# Patient Record
Sex: Male | Born: 1990 | Race: Black or African American | Hispanic: No | Marital: Single | Smoking: Current every day smoker
Health system: Southern US, Community
[De-identification: ages and names within clinical notes are randomized; demographics above are authoritative.]

---

## 2017-03-21 ENCOUNTER — Emergency Department (HOSPITAL_COMMUNITY)
Admission: EM | Admit: 2017-03-21 | Discharge: 2017-03-21 | Disposition: A | Discharge status: Home / Self Care | Payer: Self-pay | Attending: Emergency Medicine | Admitting: Emergency Medicine

## 2017-03-21 ENCOUNTER — Encounter (HOSPITAL_COMMUNITY): Payer: Self-pay | Admitting: Emergency Medicine

## 2017-03-21 ENCOUNTER — Emergency Department (HOSPITAL_COMMUNITY): Payer: Self-pay

## 2017-03-21 DIAGNOSIS — R072 Precordial pain: Secondary | ICD-10-CM | POA: Insufficient documentation

## 2017-03-21 NOTE — ED Notes (Signed)
Patient transported to X-ray 

## 2017-03-21 NOTE — ED Triage Notes (Signed)
Pt BIB EMS from a place of business. Walking around in the cold, began feeling chest & shoulder tightness & pain approx 4hrs PTA. Denies SOB, N/V. Peak T waves on ECG per EMS. Received 324mg  ASA,  1 nitro tab. Pain initially 7/10, now 5/10 after ASA & Nitro.

## 2017-03-21 NOTE — ED Notes (Signed)
The npt has been sitting in the waiting room for awhile he has been discharged

## 2017-03-21 NOTE — ED Provider Notes (Signed)
[facility]EM[facility] DEPT Provider Note   CSN: [id number] Arrival date & time: 03/21/17  0202  By signing my name below, I, Octavia HeirArianna Nassar, attest that this documentation has been prepared under the direction and in the presence of Azalia BilisKevin Cordarrel Stiefel, MD.  Electronically Signed: Octavia HeirArianna Nassar, ED Scribe. 03/21/17. 2:28 AM.    History   Chief Complaint Chief Complaint  Patient presents with  . Chest Pain   The history is provided by the patient. No language interpreter was used.   HPI Comments: Chad Hale is a 26 y.o. male brought in by ambulance, who presents to the Emergency Department complaining of acute onset, moderate, gradual improving chest pain x 4 hours. He reports associated mild cough. Pt says that he was walking outside in the cold when he began to have chest pain. He reports he just moved up here and does not have a very good place to stay. Pt received 324 mg of ASA and 1 NTG by EMS which modified his pain minimally. He denies shortness of breath.   No past medical history on file.  There are no active problems to display for this patient.   No past surgical history on file.     Home Medications    Prior to Admission medications   Not on File    Family History No family history on file.  Social History Social History  Substance Use Topics  . Smoking status: Not on file  . Smokeless tobacco: Not on file  . Alcohol use Not on file     Allergies   Patient has no allergy information on record.   Review of Systems Review of Systems  A complete 10 system review of systems was obtained and all systems are negative except as noted in the HPI and PMH.   Physical Exam Updated Vital Signs SpO2 98%   Physical Exam  Constitutional: He is oriented to person, place, and time. He appears well-developed and well-nourished.  HENT:  Head: Normocephalic and atraumatic.  Eyes: EOM are normal.  Neck: Normal range of motion.  Cardiovascular: Normal rate, regular  rhythm, normal heart sounds and intact distal pulses.   Pulmonary/Chest: Effort normal and breath sounds normal. No respiratory distress.  Abdominal: Soft. He exhibits no distension. There is no tenderness.  Musculoskeletal: Normal range of motion.  Neurological: He is alert and oriented to person, place, and time.  Skin: Skin is warm and dry.  Psychiatric: He has a normal mood and affect. Judgment normal.  Nursing note and vitals reviewed.    ED Treatments / Results  DIAGNOSTIC STUDIES: Oxygen Saturation is 98% on RA, normal by my interpretation.  COORDINATION OF CARE:  2:27 AM Discussed treatment plan which includes CXR with pt at bedside and pt agreed to plan.  Labs (all labs ordered are listed, but only abnormal results are displayed) Labs Reviewed - No data to display  EKG  EKG Interpretation  Date/Time:  Saturday March 21 2017 02:23:23 EDT Ventricular Rate:  53 PR Interval:    QRS Duration: 79 QT Interval:  436 QTC Calculation: 410 R Axis:   80 Text Interpretation:  Sinus rhythm RSR' in V1 or V2, probably normal variant Early repolarization No old tracing to compare Confirmed by Navy Rothschild  MD, Caryn BeeKEVIN ([id number]) on 03/21/2017 2:43:10 AM       Radiology Dg Chest 2 View  Result Date: 03/21/2017 CLINICAL DATA:  26 year old male with chest pain. EXAM: CHEST  2 VIEW COMPARISON:  None. FINDINGS: There is mild eventration of  the left hemidiaphragm with minimal left lung base atelectatic changes. No focal consolidation, pleural effusion, or pneumothorax. The cardiac silhouette is within normal limits. No acute osseous pathology. IMPRESSION: No active cardiopulmonary disease. Electronically Signed   By: Elgie Collard M.D.   On: 03/21/2017 03:15    Procedures Procedures (including critical care time)  Medications Ordered in ED Medications - No data to display   Initial Impression / Assessment and Plan / ED Course  I have reviewed the triage vital signs and the nursing  notes.  Pertinent labs & imaging results that were available during my care of the patient were reviewed by me and considered in my medical decision making (see chart for details).     Patient is overall well-appearing.  Atypical chest pain.  No chest pain on arrival to emergency department.  Doubt PE.  Doubt ACS.  Patient is PERC negative.  EKG without ischemic changes.  Chest x-ray clear.  Home with primary care follow-up.  He understands return to the ER for new or worsening symptoms   Final Clinical Impressions(s) / ED Diagnoses   Final diagnoses:  Precordial pain   I personally performed the services described in this documentation, which was scribed in my presence. The recorded information has been reviewed and is accurate.     New Prescriptions New Prescriptions   No medications on file     Azalia Bilis, MD 03/21/17 [phone]

## 2017-03-26 ENCOUNTER — Emergency Department (HOSPITAL_COMMUNITY)
Admission: EM | Admit: 2017-03-26 | Discharge: 2017-03-26 | Disposition: A | Discharge status: Home / Self Care | Payer: Self-pay | Attending: Emergency Medicine | Admitting: Emergency Medicine

## 2017-03-26 ENCOUNTER — Encounter (HOSPITAL_COMMUNITY): Payer: Self-pay

## 2017-03-26 ENCOUNTER — Inpatient Hospital Stay (HOSPITAL_COMMUNITY)
Admission: AD | Admit: 2017-03-26 | Discharge: 2017-03-31 | DRG: 885 | Disposition: A | Discharge status: Home / Self Care | Payer: Federal, State, Local not specified - Other | Source: Intra-hospital | Attending: Psychiatry | Admitting: Psychiatry

## 2017-03-26 ENCOUNTER — Encounter (HOSPITAL_COMMUNITY): Payer: Self-pay | Admitting: *Deleted

## 2017-03-26 DIAGNOSIS — T50901A Poisoning by unspecified drugs, medicaments and biological substances, accidental (unintentional), initial encounter: Secondary | ICD-10-CM

## 2017-03-26 DIAGNOSIS — R45851 Suicidal ideations: Secondary | ICD-10-CM | POA: Diagnosis present

## 2017-03-26 DIAGNOSIS — Z590 Homelessness: Secondary | ICD-10-CM

## 2017-03-26 DIAGNOSIS — Z91013 Allergy to seafood: Secondary | ICD-10-CM | POA: Diagnosis not present

## 2017-03-26 DIAGNOSIS — F329 Major depressive disorder, single episode, unspecified: Secondary | ICD-10-CM | POA: Diagnosis not present

## 2017-03-26 DIAGNOSIS — F332 Major depressive disorder, recurrent severe without psychotic features: Secondary | ICD-10-CM | POA: Diagnosis not present

## 2017-03-26 DIAGNOSIS — F17200 Nicotine dependence, unspecified, uncomplicated: Secondary | ICD-10-CM | POA: Insufficient documentation

## 2017-03-26 DIAGNOSIS — Z79899 Other long term (current) drug therapy: Secondary | ICD-10-CM | POA: Insufficient documentation

## 2017-03-26 DIAGNOSIS — Z915 Personal history of self-harm: Secondary | ICD-10-CM | POA: Diagnosis not present

## 2017-03-26 DIAGNOSIS — F419 Anxiety disorder, unspecified: Secondary | ICD-10-CM | POA: Diagnosis not present

## 2017-03-26 DIAGNOSIS — F17210 Nicotine dependence, cigarettes, uncomplicated: Secondary | ICD-10-CM | POA: Diagnosis not present

## 2017-03-26 DIAGNOSIS — T465X4A Poisoning by other antihypertensive drugs, undetermined, initial encounter: Secondary | ICD-10-CM | POA: Diagnosis present

## 2017-03-26 DIAGNOSIS — Y939 Activity, unspecified: Secondary | ICD-10-CM | POA: Insufficient documentation

## 2017-03-26 DIAGNOSIS — F411 Generalized anxiety disorder: Secondary | ICD-10-CM | POA: Diagnosis present

## 2017-03-26 DIAGNOSIS — G4700 Insomnia, unspecified: Secondary | ICD-10-CM | POA: Diagnosis not present

## 2017-03-26 DIAGNOSIS — Y929 Unspecified place or not applicable: Secondary | ICD-10-CM | POA: Insufficient documentation

## 2017-03-26 DIAGNOSIS — T1491XA Suicide attempt, initial encounter: Secondary | ICD-10-CM | POA: Insufficient documentation

## 2017-03-26 DIAGNOSIS — Y999 Unspecified external cause status: Secondary | ICD-10-CM | POA: Insufficient documentation

## 2017-03-26 DIAGNOSIS — X58XXXA Exposure to other specified factors, initial encounter: Secondary | ICD-10-CM | POA: Insufficient documentation

## 2017-03-26 LAB — RAPID URINE DRUG SCREEN, HOSP PERFORMED
Amphetamines: NOT DETECTED
BARBITURATES: NOT DETECTED
Benzodiazepines: NOT DETECTED
Cocaine: NOT DETECTED
OPIATES: NOT DETECTED
TETRAHYDROCANNABINOL: POSITIVE — AB

## 2017-03-26 LAB — COMPREHENSIVE METABOLIC PANEL
ALBUMIN: 3.9 g/dL (ref 3.5–5.0)
ALK PHOS: 53 U/L (ref 38–126)
ALT: 23 U/L (ref 17–63)
AST: 28 U/L (ref 15–41)
Anion gap: 8 (ref 5–15)
BUN: 7 mg/dL (ref 6–20)
CHLORIDE: 104 mmol/L (ref 101–111)
CO2: 26 mmol/L (ref 22–32)
CREATININE: 1.13 mg/dL (ref 0.61–1.24)
Calcium: 8.9 mg/dL (ref 8.9–10.3)
GFR calc Af Amer: >60 mL/min (ref >60)
GFR calc non Af Amer: >60 mL/min (ref >60)
GLUCOSE: 116 mg/dL — AB (ref 65–99)
Potassium: 3.3 mmol/L — ABNORMAL LOW (ref 3.5–5.1)
SODIUM: 138 mmol/L (ref 135–145)
Total Bilirubin: 0.7 mg/dL (ref 0.3–1.2)
Total Protein: 6.3 g/dL — ABNORMAL LOW (ref 6.5–8.1)

## 2017-03-26 LAB — SALICYLATE LEVEL: Salicylate Lvl: <7 mg/dL (ref 2.8–30.0)

## 2017-03-26 LAB — CBC
HEMATOCRIT: 40.3 % (ref 39.0–52.0)
HEMOGLOBIN: 13.2 g/dL (ref 13.0–17.0)
MCH: 27.1 pg (ref 26.0–34.0)
MCHC: 32.8 g/dL (ref 30.0–36.0)
MCV: 82.8 fL (ref 78.0–100.0)
Platelets: 259 10*3/uL (ref 150–400)
RBC: 4.87 MIL/uL (ref 4.22–5.81)
RDW: 12.7 % (ref 11.5–15.5)
WBC: 8.6 10*3/uL (ref 4.0–10.5)

## 2017-03-26 LAB — ETHANOL: Alcohol, Ethyl (B): <5 mg/dL (ref <5)

## 2017-03-26 LAB — ACETAMINOPHEN LEVEL: Acetaminophen (Tylenol), Serum: <10 ug/mL — ABNORMAL LOW (ref 10–30)

## 2017-03-26 MED ORDER — LORAZEPAM 1 MG PO TABS: 1.0000 mg | ORAL_TABLET | Freq: Three times a day (TID) | ORAL | Status: DC | PRN

## 2017-03-26 MED ORDER — IBUPROFEN 400 MG PO TABS: 600.0000 mg | ORAL_TABLET | Freq: Three times a day (TID) | ORAL | Status: DC | PRN

## 2017-03-26 MED ORDER — MAGNESIUM HYDROXIDE 400 MG/5ML PO SUSP
30.0000 mL | Freq: Every day | ORAL | Status: DC | PRN
  Administered 2017-03-26: 30 mL via ORAL
  Filled 2017-03-26: qty 30

## 2017-03-26 MED ORDER — HYDROXYZINE HCL 25 MG PO TABS
25.0000 mg | ORAL_TABLET | Freq: Four times a day (QID) | ORAL | Status: DC | PRN
  Administered 2017-03-27 – 2017-03-31 (×8): 25 mg via ORAL
  Filled 2017-03-26 (×5): qty 1
  Filled 2017-03-26: qty 10
  Filled 2017-03-26 (×4): qty 1

## 2017-03-26 MED ORDER — TRAZODONE HCL 100 MG PO TABS
100.0000 mg | ORAL_TABLET | Freq: Every evening | ORAL | Status: DC | PRN
  Administered 2017-03-26 – 2017-03-30 (×5): 100 mg via ORAL
  Filled 2017-03-26 (×4): qty 1
  Filled 2017-03-26: qty 7
  Filled 2017-03-26: qty 1

## 2017-03-26 MED ORDER — ACETAMINOPHEN 325 MG PO TABS
650.0000 mg | ORAL_TABLET | Freq: Four times a day (QID) | ORAL | Status: DC | PRN
  Administered 2017-03-28 (×2): 650 mg via ORAL
  Filled 2017-03-26 (×2): qty 2

## 2017-03-26 NOTE — BH Assessment (Signed)
Per Dr. Lucianne MussKumar - patient meets criteria for inpatient hospitalization. [facility]  The patient can come after 12.  Dr. Jama Flavorsobos is the accepting physician.   Writer informed the nurse of the disposition.

## 2017-03-26 NOTE — ED Notes (Signed)
Report received from [place], [place] -- pt ambulatory to rm F8 -- eating breakfast. Pt denies hearing voices at present, states he was SI when he took pills, but feels safe at present. Sitter at bedside.

## 2017-03-26 NOTE — ED Notes (Signed)
Staffing office made aware of need for suicide sitter for patient.

## 2017-03-26 NOTE — Progress Notes (Addendum)
Admission note:  Patient is very vague about why he is here.  He is a 26 yo male that [facility]."  Reportedly, patient took approximately 6-8 clonidine pills and "felt sick."  He states he just separated from his wife "a week ago and she was here last week."  Patient was angry and agitated.  At first, he refused to sign any papers or cooperate with admission.  He stated, "I want to go."  Explained to patient he was under IVC and that we had no control over that.  He states he felt some suicidal ideation over his separation.  He then shared that his wife was admitted here "to get away from me."  Patient states, "I have a two court dates coming up for domestic assault and carrying a concealed weapon."  Note:  Patient came in with two large knives.  Patient arrived with several bags of clothing, personal laptop, cell phone, wallet, and $175.00 in cash which was locked up in the safe. From ED report, patient was originally voluntary and when [place] arrived, he refused to go.  He was IVC'd due to his threats of "running."  Patient has no pertinent medical hx.  He is a smoker; refuses the patch.  He did sign his paperwork and cooperated after some prompting.  [facility] on 500 hall due to possible elopement risk/escalating behavior.  Patient is calm at this time.  He denies any use of alcohol or drugs.  He denies any depressive symptoms or thoughts of self harm.  He denies HI and does not appear to be responding to internal stimuli.  Note:  ED nurse did report that patient has been to various hospitals in different locations recently. Patient's UDS was positive for THC.  Patient also stated, "I need a drug assessment for court."   Patient was oriented to unit and he went to dinner.

## 2017-03-26 NOTE — Progress Notes (Signed)
Pt is new to the unit late this afternoon.  Report received from admitting nurse that pt was irritable and upset about being in the hospital.  After shift change, pt came to the nurse's station asking whether he was voluntary or involuntary.  Pt was shown his IVC paperwork and informed that the doctor would decide when or if he could sign voluntary.  He wanted to sign a 72h request for discharge, but was understanding and appropriate when given this information and told he had to be voluntary to sign the form.  Pt requested that his Dove soap be gotten out of the locker.  Writer got the soap and a couple of other toiletry items for pt.  He was appreciative, and has been polite and appropriate with staff this evening.  He went to [employer] group and participated.  He has made his needs known appropriately.  He denies SI/HI/AVH at this time.  Support and encouragement offered.  Discharge plans are in process.  Safety maintained with q15 minute checks.

## 2017-03-26 NOTE — BH Assessment (Signed)
Tele Assessment Note   Governor RooksMercedes Hale is an 26 y.o. male that reports SI with a plan to overdose.  Patient reports increased depressed associated with being separated from his wife.   Patient reports that he is not able to contract for safety.    Patient denies HI/Psychosis/Substance Abuse.     Diagnosis: Major Depressive Disorder, Severe, without psychosis  Past Medical History: History reviewed. No pertinent past medical history.  History reviewed. No pertinent surgical history.  Family History: No family history on file.  Social History:  reports that he has been smoking.  He has been smoking about 1.00 pack per day. He has never used smokeless tobacco. He reports that he does not drink alcohol. His drug history is not on file.  Additional Social History:  Alcohol / Drug Use History of alcohol / drug use?: No history of alcohol / drug abuse  CIWA: CIWA-Ar BP: 114/84 Pulse Rate: 72 COWS:    PATIENT STRENGTHS: (choose at least two) Average or above average intelligence Capable of independent living Communication skills Physical Health  Allergies:  Allergies  Allergen Reactions  . Shellfish Allergy Anaphylaxis    Home Medications:  (Not in a hospital admission)  OB/GYN Status:  No LMP for male patient.  General Assessment Data [facility] Assessment: In system Is this a Tele or Face-to-Face Assessment?: Tele Assessment Is this an Initial Assessment or a Re-assessment for this encounter?: Initial Assessment Marital status: Single Maiden name: NA Is patient pregnant?: No Pregnancy Status: No Living Arrangements: Other relatives Can pt return to current living arrangement?: Yes Admission Status: Voluntary Is patient capable of signing voluntary admission?: Yes Referral Source: Self/Family/Friend Insurance type: Self Pay  Medical Screening Exam [facility] Walk-[facility] ONLY) Medical Exam completed:  (NA)  Crisis Care Plan Living Arrangements:  Other relatives Legal Guardian:  (NA) Name of Psychiatrist: None Reorted Name of Therapist: None Reorted  Education Status Is patient currently in school?: No Current Grade: NA Highest grade of school patient has completed: NA Name of school: NA Contact person: N  Risk to self with the past 6 months Suicidal Ideation: Yes-Currently Present Has patient been a risk to self within the past 6 months prior to admission? : Yes Suicidal Intent: Yes-Currently Present Has patient had any suicidal intent within the past 6 months prior to admission? : Yes Is patient at risk for suicide?: Yes Suicidal Plan?: Yes-Currently Present Has patient had any suicidal plan within the past 6 months prior to admission? : Yes Specify Current Suicidal Plan: Plan to overdose on  pills Access to Means: Yes Specify Access to Suicidal Means: Overdose on pills What has been your use of drugs/alcohol within the last 12 months?: NA Previous Attempts/Gestures: No How many times?: 0 Other Self Harm Risks: None Reported Triggers for Past Attempts:  (NA) Intentional Self Injurious Behavior: None Family Suicide History: No Recent stressful life event(s): Divorce (Seperated from his wife) Persecutory voices/beliefs?: No Depression: Yes Depression Symptoms: Despondent, Insomnia, Tearfulness, Isolating, Fatigue, Loss of interest in usual pleasures, Guilt, Feeling worthless/self pity, Feeling angry/irritable Substance abuse history and/or treatment for substance abuse?: No Suicide prevention information given to non-admitted patients: Not applicable  Risk to Others within the past 6 months Homicidal Ideation: No Does patient have any lifetime risk of violence toward others beyond the six months prior to admission? : No Thoughts of Harm to Others: No Current Homicidal Intent: No Current Homicidal Plan: No Access to Homicidal Means: No Identified Victim: None Reported  History of harm to others?: No Assessment of  Violence: None Noted Violent Behavior Description: n Does patient have access to weapons?: No Criminal Charges Pending?: Yes Describe Pending Criminal Charges: Possession of a gun Does patient have a court date: Yes Court Date: 04/25/17 Is patient on probation?: No  Psychosis Hallucinations: None noted Delusions: None noted  Mental Status Report Appearance/Hygiene: Disheveled Eye Contact: Poor Motor Activity: Freedom of movement Speech: Logical/coherent Level of Consciousness: Alert Mood: Depressed, Despair, Fearful, Guilty, Helpless Affect: Blunted Anxiety Level: Minimal Thought Processes: Coherent, Relevant Judgement: Impaired Orientation: Person, Place, Time, Situation Obsessive Compulsive Thoughts/Behaviors: None  Cognitive Functioning Concentration: Decreased Memory: Recent Intact, Remote Intact IQ: Average Insight: Poor Impulse Control: Poor Appetite: Poor Weight Loss: 5 Weight Gain: 0 Sleep: Decreased Total Hours of Sleep: 4 Vegetative Symptoms: Decreased grooming, Staying in bed  ADLScreening [facility] Assessment Services) Patient's cognitive ability adequate to safely complete daily activities?: Yes Patient able to express need for assistance with ADLs?: Yes Independently performs ADLs?: Yes (appropriate for developmental age)  Prior Inpatient Therapy Prior Inpatient Therapy: No Prior Therapy Dates: NA Prior Therapy Facilty/Provider(s): NA Reason for Treatment: NA  Prior Outpatient Therapy Prior Outpatient Therapy: No Prior Therapy Dates: NA Prior Therapy Facilty/Provider(s): NA Reason for Treatment: NA Does patient have an ACCT team?: No Does patient have Intensive In-House Services?  : No Does patient have [facility] services? : No Does patient have P4CC services?: No  ADL Screening (condition at time of admission) Patient's cognitive ability adequate to safely complete daily activities?: Yes Is the patient deaf or have difficulty hearing?: No Does  the patient have difficulty seeing, even when wearing glasses/contacts?: No Does the patient have difficulty concentrating, remembering, or making decisions?: Yes Patient able to express need for assistance with ADLs?: Yes Does the patient have difficulty dressing or bathing?: No Independently performs ADLs?: Yes (appropriate for developmental age) Does the patient have difficulty walking or climbing stairs?: No Weakness of Legs: None Weakness of Arms/Hands: None  Home Assistive Devices/Equipment Home Assistive Devices/Equipment: None    Abuse/Neglect Assessment (Assessment to be complete while patient is alone) Physical Abuse: Denies Verbal Abuse: Denies Sexual Abuse: Denies Exploitation of patient/patient's resources: Denies Self-Neglect: Denies Values / Beliefs Cultural Requests During Hospitalization: None Spiritual Requests During Hospitalization: None Consults Spiritual Care Consult Needed: No Social Work Consult Needed: No Merchant navy officer (For Healthcare) Does Patient Have a Medical Advance Directive?: No Would patient like information on creating a medical advance directive?: No - Patient declined    Additional Information 1:1 In Past 12 Months?: No CIRT Risk: No Elopement Risk: No Does patient have medical clearance?: Yes     Disposition: Per Dr. Lucianne Muss - patient meets criteria for inpatient hospitalization. Per [facility] Mardella Layman) patient accepted to [facility] Bed 401-2.  The patient can come after 12.  Dr. Lucianne Muss is the accepting physician.    Disposition Initial Assessment Completed for this Encounter: Yes Disposition of Patient: Inpatient treatment program Type of inpatient treatment program: Adult  Linton Rump 03/26/2017 10:43 AM

## 2017-03-26 NOTE — ED Provider Notes (Signed)
[facility]-EM[facility]Y DEPT Provider Note   CSN: [id number] Arrival date & time: 03/26/17  0215     History   Chief Complaint Chief Complaint  Patient presents with  . Suicide Attempt  . Drug Overdose    HPI Chad Hale is a 26 y.o. male.  The history is provided by the patient.  Drug Overdose  This is a new problem. The current episode started 6 to 12 hours ago. The problem occurs constantly. The problem has been gradually improving. Associated symptoms include abdominal pain. Pertinent negatives include no chest pain and no shortness of breath. Nothing aggravates the symptoms. Nothing relieves the symptoms.  Patient reports taking about 6 tablets of clonidine (unknown dosage) At approximately 2200 He did this in a suicide attempt He reports he has abdominal discomfort since taking the meds Denies ETOH use No fever No CP No significant HA   PMH - none  Home Medications    Prior to Admission medications   Not on File    Family History No family history on file.  Social History Social History  Substance Use Topics  . Smoking status: Current Every Day Smoker    Packs/day: 1.00  . Smokeless tobacco: Never Used  . Alcohol use No     Allergies   Shellfish allergy   Review of Systems Review of Systems  Constitutional: Negative for fever.  Respiratory: Negative for shortness of breath.   Cardiovascular: Negative for chest pain.  Gastrointestinal: Positive for abdominal pain.  All other systems reviewed and are negative.    Physical Exam Updated Vital Signs BP (!) 118/58 (BP Location: Right Arm)   Pulse 62   Temp 97.7 F (36.5 C) (Oral)   Resp 16   Ht 5\' 10"  (1.778 m)   Wt 72.6 kg   SpO2 100%   BMI 22.96 kg/m   Physical Exam CONSTITUTIONAL:  Disheveled, no acute distress HEAD: Normocephalic/atraumatic EYES: EOMI/PERRL ENMT: Mucous membranes moist NECK: supple no meningeal signs SPINE/BACK:entire spine nontender CV: S1/S2 noted, no  murmurs/rubs/gallops noted LUNGS: Lungs are clear to auscultation bilaterally, no apparent distress ABDOMEN: soft, mild epigastric tenderness, no rebound or guarding, bowel sounds noted throughout abdomen GU:no cva tenderness NEURO: Pt is awake/alert/appropriate, moves all extremitiesx4.  No facial droop.   EXTREMITIES: pulses normal/equal, full ROM SKIN: warm, color normal PSYCH: pt appears depressed  ED Treatments / Results  Labs (all labs ordered are listed, but only abnormal results are displayed) Labs Reviewed  COMPREHENSIVE METABOLIC PANEL - Abnormal; Notable for the following:       Result Value   Potassium 3.3 (*)    Glucose, Bld 116 (*)    Total Protein 6.3 (*)    All other components within normal limits  ACETAMINOPHEN LEVEL - Abnormal; Notable for the following:    Acetaminophen (Tylenol), Serum <10 (*)    All other components within normal limits  RAPID URINE DRUG SCREEN, HOSP PERFORMED - Abnormal; Notable for the following:    Tetrahydrocannabinol POSITIVE (*)    All other components within normal limits  ETHANOL  SALICYLATE LEVEL  CBC    EKG  EKG Interpretation  Date/Time:  Thursday March 26 2017 02:40:51 EDT Ventricular Rate:  61 PR Interval:  128 QRS Duration: 92 QT Interval:  398 QTC Calculation: 400 R Axis:   77 Text Interpretation:  Normal sinus rhythm Septal infarct , age undetermined Abnormal ECG No significant change since last tracing Confirmed by Bebe Shaggy  MD, Haedyn Breau ([id number]) on 03/26/2017 5:06:27 AM  Radiology No results found.  Procedures Procedures (  Medications Ordered in ED Medications - No data to display   Initial Impression / Assessment and Plan / ED Course  I have reviewed the triage vital signs and the nursing notes.  Pertinent labs  results that were available during my care of the patient were reviewed by me and considered in my medical decision making (see chart for details).     6:14 AM Pt stable No EKG  changes Vitals are appropriate It has been >6 hrs since ingestion, and per poison center, pt should be cleared Labs reassuring Pt awake/alert Will consult psych Pt medically stable at this time  Final Clinical Impressions(s) / ED Diagnoses   Final diagnoses:  Suicide attempt    New Prescriptions New Prescriptions   No medications on file     Zadie Rhineonald Myleigh Amara, MD 03/26/17 [phone]

## 2017-03-26 NOTE — ED Notes (Signed)
This RN contacted poison control regarding patient's reported ingestion of clonidine, Chad Hale with poison control advised to monitor patient for hypotension, bradycardia and drowsiness, stating that if no significant s/s occur within about 6 hours of pts reported ingestion time, patient should be safe to be medically cleared.
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
## 2017-03-26 NOTE — ED Triage Notes (Signed)
Pt reports taking pills that he stole from a friend and not feeling well. Pt sates he does not know what the name of the pill is but thinks it may have been clonidine. HE is unsure of th MG but sates about 6 pills at about 3 hours ago. PT states he took the pills with intentions to kill himself but does not know if he still wants to. No HI. Pt is complaining of abdominal pain since ingestion

## 2017-03-26 NOTE — ED Notes (Signed)
TTS video conference camera set up at bedside .

## 2017-03-26 NOTE — ED Notes (Signed)
[place] transportation here-- explained to pt that [facility] and [facility] here-- pt stated- "what if I run? I don't want to go there" when asked why, pt stated "I don't have the money, don't have time" pt eating lunch. Sent [place]ham away, [facility] pt to be IVC'd. 24hour and IVC paperwork filled out, faxed to magistrate.

## 2017-03-26 NOTE — ED Notes (Signed)
Pt placed in wine scrubs and wanded by security. Pt belongings placed outside of room at nurses station and 2 knifes confiscated by security.

## 2017-03-27 DIAGNOSIS — T465X4A Poisoning by other antihypertensive drugs, undetermined, initial encounter: Secondary | ICD-10-CM

## 2017-03-27 DIAGNOSIS — Z79899 Other long term (current) drug therapy: Secondary | ICD-10-CM

## 2017-03-27 DIAGNOSIS — F329 Major depressive disorder, single episode, unspecified: Secondary | ICD-10-CM

## 2017-03-27 MED ORDER — SERTRALINE HCL 50 MG PO TABS
50.0000 mg | ORAL_TABLET | Freq: Every day | ORAL | Status: DC
  Filled 2017-03-27 (×4): qty 1

## 2017-03-27 MED ORDER — POTASSIUM CHLORIDE CRYS ER 10 MEQ PO TBCR
10.0000 meq | EXTENDED_RELEASE_TABLET | Freq: Two times a day (BID) | ORAL | Status: AC
  Administered 2017-03-27: 10 meq via ORAL
  Filled 2017-03-27 (×3): qty 1

## 2017-03-27 MED ORDER — NICOTINE 21 MG/24HR TD PT24
21.0000 mg | MEDICATED_PATCH | Freq: Every day | TRANSDERMAL | Status: DC
  Filled 2017-03-27 (×5): qty 1

## 2017-03-27 NOTE — Plan of Care (Signed)
Problem: Safety: Goal: Periods of time without injury will increase Outcome: Progressing Pt safe on the unit at this time   

## 2017-03-27 NOTE — Progress Notes (Signed)
Recreation Therapy Notes  INPATIENT RECREATION THERAPY ASSESSMENT  Patient Details Name: Chad Hale MRN: [id number] DOB: [birth year] Today's Date: 03/27/2017  Patient Stressors: Family, Work  Pt stated he was here for suicide attempt.  Coping Skills:   Substance Abuse, Avoidance, Self-Injury, Exercise, Talking, Music, Sports  Personal Challenges: Anger, Communication, Concentration, Decision-Making, Expressing Yourself, Problem-Solving, Relationships, Self-Esteem/Confidence, Stress Management, Time Management, Trusting Others  Leisure Interests (2+):  Sports - Basketball, Music - Listen, Individual - Other (Comment) (Go for a drive)  Awareness of Community Resources:  Yes  Community Resources:  [place]  Current Use: Yes  Patient Strengths:  People person  Patient Identified Areas of Improvement:  Communication; Time management  Current Recreation Participation:  Hardly ever  Patient Goal for Hospitalization:  "I don't really have one, clear up some of these thoughts in my head"  [place] of Residence:  [place] of Residence:  [place]  Current [place] (including self-harm):  No  Current HI:  No  Consent to Intern Participation: N/A   Caroll Rancher, LRT/CTRS  Caroll Rancher A 03/27/2017, 2:11 PM

## 2017-03-27 NOTE — Progress Notes (Signed)
Recreation Therapy Notes  Date: 03/27/17 Time: 1000 Location: 500 [address] Dayroom  Group Topic: Leisure Education  Goal Area(s) Addresses:  Patient will identify positive leisure activities.  Patient will identify one positive benefit of participation in leisure activities.   Intervention: Leisure activities, can, dry erase marker, dry erase board, eraser  Activity: Leisure Pictionary.  One patient would come up and draw a slip from the can containing a leisure activity on it.  The patient is to then draw the activity on the board.  The remaining patients are to try and guess the picture.  The person that guesses the picture will get the next turn.  Education:  Leisure Education, Building control surveyor  Education Outcome: Acknowledges education/In group clarification offered/Needs additional education  Clinical Observations/Feedback: Pt did not attend group.   Caroll Rancher, LRT/CTRS         Caroll Rancher A 03/27/2017 12:46 PM

## 2017-03-27 NOTE — [facility] Suicide Risk Assessment (Addendum)
[facility] INPATIENT:  Family/Significant Other Suicide Prevention Education  Suicide Prevention Education:  Education Completed; No one, has been identified by the patient as the family member/significant other with whom the patient will be residing, and identified as the person(s) who will aid the patient in the event of a mental health crisis (suicidal ideations/suicide attempt).  With written consent from the patient, the family member/significant other has been provided the following suicide prevention education, prior to the and/or following the discharge of the patient.  The suicide prevention education provided includes the following:  Suicide risk factors  Suicide prevention and interventions  National Suicide Hotline telephone number  [facility] assessment telephone number  [facility] Emergency Assistance 911  [facility] and/or Residential Mobile Crisis Unit telephone number  Request made of family/significant other to:  Remove weapons (e.g., guns, rifles, knives), all items previously/currently identified as safety concern.    Remove drugs/medications (over-the-counter, prescriptions, illicit drugs), all items previously/currently identified as a safety concern.  The family member/significant other verbalizes understanding of the suicide prevention education information provided.  The family member/significant other agrees to remove the items of safety concern listed above.  Pt refused to sign release. SPE discussed with pt.  Jonathon Jordan, MSW, LCSWA  03/27/2017, 3:41 PM

## 2017-03-27 NOTE — H&P (Addendum)
Psychiatric Admission Assessment Adult  Patient Identification: Chad Hale MRN:  [id number] Date of Evaluation:  03/27/2017 Chief Complaint:   " I took some pills " Principal Diagnosis: Overdose, Undetermined Intent  Diagnosis:   Patient Active Problem List   Diagnosis Date Noted  . MDD (major depressive disorder) [F32.9] 03/26/2017   History of Present Illness: 26 year old male . States he has been having " a rough time recently". States he recently separated from his wife, whom he states took his tax return money, so that he has been homeless, " staying in the streets". States he impulsively took about 6 Clonidine tablets ( not his) . States he is not sure it was a suicide attempt, just needed to " get out of my head for a little bit". ED chart notes indicate patient initially reported suicidal ideations. After he overdosed he had significant abdominal pain so he went to the hospital.  Associated Signs/Symptoms: Depression Symptoms:  depressed mood, suicidal attempt,  Of note, denies anhedonia, denies changes in sleep, energy level , appetite. (Hypo) Manic Symptoms:  Does not endorse  Anxiety Symptoms:   Denies excessive anxiety, denies panic or agoraphobia Psychotic Symptoms:  Denies any hallucinations PTSD Symptoms: Denies  Total Time spent with patient: 45 minutes  Past Psychiatric History: One prior psychiatric admission 3 years ago, which he states was an overnight stay,, suicide attempt by overdosing with alcohol and NSAID, no history of mania, no history of psychosis, denies panic or agoraphobia, denies history of PTSD. Denies any history of violence   Is the patient at risk to self? Yes.    Has the patient been a risk to self in the past 6 months? No.  Has the patient been a risk to self within the distant past? No.  Is the patient a risk to others? No.  Has the patient been a risk to others in the past 6 months? No.  Has the patient been a risk to others within  the distant past? No.   Prior Inpatient Therapy:  as above  Prior Outpatient Therapy:  states he has never been in outpatient treatment before   Alcohol Screening: 1. How often do you have a drink containing alcohol?: Monthly or less 2. How many drinks containing alcohol do you have on a typical day when you are drinking?: 1 or 2 3. How often do you have six or more drinks on one occasion?: Never Preliminary Score: 0 9. Have you or someone else been injured as a result of your drinking?: No 10. Has a relative or friend or a doctor or another health worker been concerned about your drinking or suggested you cut down?: No Alcohol Use Disorder Identification Test Final Score (AUDIT): 1 Brief Intervention: AUDIT score less than 7 or less-screening does not suggest unhealthy drinking-brief intervention not indicated Substance Abuse History in the last 12 months:  Denies alcohol abuse, denies drug abuse  Consequences of Substance Abuse: Denies  Previous Psychotropic Medications: states he has never been on any psychiatric medications Psychological Evaluations:  No  Past Medical History: Denies any medical ideations, reports shellfish and iodine allergy, smokes about one pack per day  Family History: parents alive, no relationship with father. Has two sisters   Family Psychiatric  History: denies any history of psychiatric illness, denies any history of suicides in family  Tobacco Screening: Have you used any form of tobacco in the last 30 days? (Cigarettes, Smokeless Tobacco, Cigars, and/or Pipes): Yes Tobacco use, Select all that  apply: 5 or more cigarettes per day Are you interested in Tobacco Cessation Medications?: Yes, will notify MD for an order Counseled patient on smoking cessation including recognizing danger situations, developing coping skills and basic information about quitting provided: Refused/Declined practical counseling Social History:  Married - x 5 years, currently separated,  currently homeless, has been working intermittently via a temporary service , no children, has upcoming court date for domestic violence  History  Alcohol Use No     History  Drug use: Unknown    Additional Social History: Marital status: Separated Separated, when?: For 2 years  What types of issues is patient dealing with in the relationship?: "Stability"  Additional relationship information: Pt and his wife are in the middle of going through a divorce were married for 5 yrs  Are you sexually active?: No What is your sexual orientation?: Straight  Has your sexual activity been affected by drugs, alcohol, medication, or emotional stress?: No Does patient have children?: No  Allergies:   Allergies  Allergen Reactions  . Shellfish Allergy Anaphylaxis   Lab Results:  Results for orders placed or performed during the hospital encounter of 03/26/17 (from the past 48 hour(s))  Comprehensive metabolic panel     Status: Abnormal   Collection Time: 03/26/17  3:08 AM  Result Value Ref Range   Sodium 138 135 - 145 mmol/L   Potassium 3.3 (L) 3.5 - 5.1 mmol/L   Chloride 104 101 - 111 mmol/L   CO2 26 22 - 32 mmol/L   Glucose, Bld 116 (H) 65 - 99 mg/dL   BUN 7 6 - 20 mg/dL   Creatinine, Ser 1.13 0.61 - 1.24 mg/dL   Calcium 8.9 8.9 - 10.3 mg/dL   Total Protein 6.3 (L) 6.5 - 8.1 g/dL   Albumin 3.9 3.5 - 5.0 g/dL   AST 28 15 - 41 U/L   ALT 23 17 - 63 U/L   Alkaline Phosphatase 53 38 - 126 U/L   Total Bilirubin 0.7 0.3 - 1.2 mg/dL   GFR calc non Af Amer >60 >60 mL/min   GFR calc Af Amer >60 >60 mL/min    Comment: (NOTE) The eGFR has been calculated using the CKD EPI equation. This calculation has not been validated in all clinical situations. eGFR's persistently <60 mL/min signify possible Chronic Kidney Disease.    Anion gap 8 5 - 15  Ethanol     Status: None   Collection Time: 03/26/17  3:08 AM  Result Value Ref Range   Alcohol, Ethyl (B) <5 <5 mg/dL    Comment:        LOWEST  DETECTABLE LIMIT FOR SERUM ALCOHOL IS 5 mg/dL FOR MEDICAL PURPOSES ONLY   Salicylate level     Status: None   Collection Time: 03/26/17  3:08 AM  Result Value Ref Range   Salicylate Lvl <0.3 2.8 - 30.0 mg/dL  Acetaminophen level     Status: Abnormal   Collection Time: 03/26/17  3:08 AM  Result Value Ref Range   Acetaminophen (Tylenol), Serum <10 (L) 10 - 30 ug/mL    Comment:        THERAPEUTIC CONCENTRATIONS VARY SIGNIFICANTLY. A RANGE OF 10-30 ug/mL MAY BE AN EFFECTIVE CONCENTRATION FOR MANY PATIENTS. HOWEVER, SOME ARE BEST TREATED AT CONCENTRATIONS OUTSIDE THIS RANGE. ACETAMINOPHEN CONCENTRATIONS >150 ug/mL AT 4 HOURS AFTER INGESTION AND >50 ug/mL AT 12 HOURS AFTER INGESTION ARE OFTEN ASSOCIATED WITH TOXIC REACTIONS.   cbc     Status: None  Collection Time: 03/26/17  3:08 AM  Result Value Ref Range   WBC 8.6 4.0 - 10.5 K/uL   RBC 4.87 4.22 - 5.81 MIL/uL   Hemoglobin 13.2 13.0 - 17.0 g/dL   HCT 40.3 39.0 - 52.0 %   MCV 82.8 78.0 - 100.0 fL   MCH 27.1 26.0 - 34.0 pg   MCHC 32.8 30.0 - 36.0 g/dL   RDW 12.7 11.5 - 15.5 %   Platelets 259 150 - 400 K/uL  Rapid urine drug screen (hospital performed)     Status: Abnormal   Collection Time: 03/26/17  4:40 AM  Result Value Ref Range   Opiates NONE DETECTED NONE DETECTED   Cocaine NONE DETECTED NONE DETECTED   Benzodiazepines NONE DETECTED NONE DETECTED   Amphetamines NONE DETECTED NONE DETECTED   Tetrahydrocannabinol POSITIVE (A) NONE DETECTED   Barbiturates NONE DETECTED NONE DETECTED    Comment:        DRUG SCREEN FOR MEDICAL PURPOSES ONLY.  IF CONFIRMATION IS NEEDED FOR ANY PURPOSE, NOTIFY LAB WITHIN 5 DAYS.        LOWEST DETECTABLE LIMITS FOR URINE DRUG SCREEN Drug Class       Cutoff (ng/mL) Amphetamine      1000 Barbiturate      200 Benzodiazepine   449 Tricyclics       675 Opiates          300 Cocaine          300 THC              50     Blood Alcohol level:  Lab Results  Component Value Date   ETH  <5 91/63/8466    Metabolic Disorder Labs:  No results found for: HGBA1C, MPG No results found for: PROLACTIN No results found for: CHOL, TRIG, HDL, CHOLHDL, VLDL, LDLCALC  Current Medications: Current Facility-Administered Medications  Medication Dose Route Frequency Provider Last Rate Last Dose  . acetaminophen (TYLENOL) tablet 650 mg  650 mg Oral Q6H PRN Kerrie Buffalo, NP      . hydrOXYzine (ATARAX/VISTARIL) tablet 25 mg  25 mg Oral Q6H PRN Kerrie Buffalo, NP   25 mg at 03/27/17 1313  . magnesium hydroxide (MILK OF MAGNESIA) suspension 30 mL  30 mL Oral Daily PRN Kerrie Buffalo, NP   30 mL at 03/26/17 2156  . nicotine (NICODERM CQ - dosed in mg/24 hours) patch 21 mg  21 mg Transdermal Daily Rozetta Nunnery, NP      . traZODone (DESYREL) tablet 100 mg  100 mg Oral QHS PRN Kerrie Buffalo, NP   100 mg at 03/26/17 2157   PTA Medications: No prescriptions prior to admission.    Musculoskeletal: Strength & Muscle Tone: within normal limits Gait & Station: normal Patient leans: N/A  Psychiatric Specialty Exam: Physical Exam  Review of Systems  Constitutional: Negative.   HENT: Negative.   Eyes: Negative.   Respiratory: Negative.   Cardiovascular: Negative.   Gastrointestinal: Negative.   Genitourinary: Negative.   Musculoskeletal: Negative.   Skin: Negative.   Neurological: Negative for seizures.  Endo/Heme/Allergies: Negative.   Psychiatric/Behavioral: Positive for depression and suicidal ideas.    Blood pressure 123/71, pulse 60, temperature 98.1 F (36.7 C), temperature source Oral, resp. rate 18, height _0  (1.778 m), weight 70.3 kg (155 lb), SpO2 100 %.Body mass index is 22.24 kg/m.  General Appearance: Fairly Groomed  Eye Contact:  Good  Speech:  Normal Rate  Volume:  Decreased  Mood:  reports  improved mood, and minimizes depression today  Affect:  vaguely constricted   Thought Process:  Goal Directed, Linear and Descriptions of Associations: Intact   Orientation:  Full (Time, Place, and Person)  Thought Content:  denies hallucinations, no delusions expressed   Suicidal Thoughts:  No denies any suicidal or self injurious ideations, contracts for safety on unit, denies any homicidal or violent ideations, and specifically denies thoughts of violence towards wife   Homicidal Thoughts:  No  Memory:  recent and remote grossly intact   Judgement:  Fair  Insight:  Fair  Psychomotor Activity:  Normal  Concentration:  Concentration: Good and Attention Span: Good  Recall:  Good  Fund of Knowledge:  Good  Language:  Good  Akathisia:  Negative  Handed:  Left  AIMS (if indicated):     Assets:  Desire for Improvement Resilience  ADL's:  Intact  Cognition:  WNL  Sleep:  Number of Hours: 6.75    Treatment Plan Summary: Daily contact with patient to assess and evaluate symptoms and progress in treatment, Medication management, Plan inpatient admission  and medications as below  Observation Level/Precautions:  15 minute checks  Laboratory:  as needed   Psychotherapy: milieu, support , group therapy    Medications:  We discussed options - at this time agrees to an antidepressant trial-  Start Zoloft 50 mgrs QAM- side effects and rationale discussed  Continue Trazodone and Vistaril PRNs as needed   Consultations:  As needed  Discharge Concerns:  - homelessness   Estimated LOS: 5 days   Other:     Physician Treatment Plan for Primary Diagnosis:  Overdose , Undetermined Intent  Long Term Goal(s): Improvement in symptoms so as ready for discharge  Short Term Goals: Ability to verbalize feelings will improve, Ability to disclose and discuss suicidal ideas, Ability to demonstrate self-control will improve and Ability to identify and develop effective coping behaviors will improve  Physician Treatment Plan for Secondary Diagnosis: Depression  Long Term Goal(s): Improvement in symptoms so as ready for discharge  Short Term Goals: Ability to  disclose and discuss suicidal ideas, Ability to demonstrate self-control will improve, Ability to identify and develop effective coping behaviors will improve, Ability to maintain clinical measurements within normal limits will improve and Compliance with prescribed medications will improve  I certify that inpatient services furnished can reasonably be expected to improve the patient's condition.    Jenne Campus, MD 3/30/20184:00 PM

## 2017-03-27 NOTE — [facility] Suicide Risk Assessment (Signed)
[facility] INPATIENT:  Family/Significant Other Suicide Prevention Education  Suicide Prevention Education:  Patient Refusal for Family/Significant Other Suicide Prevention Education: The patient Chad Hale has refused to provide written consent for family/significant other to be provided Family/Significant Other Suicide Prevention Education during admission and/or prior to discharge.  Physician notified.  Ida Rogue 03/27/2017, 3:42 PM

## 2017-03-27 NOTE — Progress Notes (Signed)
D: Pt denies SI/HI/AVH. Pt is pleasant and cooperative. Pt concerned about his situation with his wife. Pt stated he wants it to work out, but don't think she wants to be with him.   A: Pt was offered support and encouragement. Pt was given scheduled medications. Pt was encourage to attend groups. Q 15 minute checks were done for safety.   R:Pt attends groups and interacts well with peers and staff. Pt is taking medication. Pt has no complaints at this time.Pt receptive to treatment and safety maintained on unit.

## 2017-03-27 NOTE — [facility] Suicide Risk Assessment (Signed)
[facility] Admission Suicide Risk Assessment   Nursing information obtained from:  Patient Demographic factors:  Low socioeconomic status, Living alone Current Mental Status:  Self-harm thoughts Loss Factors:  Loss of significant relationship, Legal issues Historical Factors:  Domestic violence Risk Reduction Factors:  NA  Total Time spent with patient: 45 minutes Principal Problem:  Overdose, Undetermined Intent  Diagnosis:   Patient Active Problem List   Diagnosis Date Noted  . MDD (major depressive disorder) [F32.9] 03/26/2017    Continued Clinical Symptoms:  Alcohol Use Disorder Identification Test Final Score (AUDIT): 1 The "Alcohol Use Disorders Identification Test", Guidelines for Use in Primary Care, Second Edition.  World Science writer [facility]). Score between 0-7:  no or low risk or alcohol related problems. Score between 8-15:  moderate risk of alcohol related problems. Score between 16-19:  high risk of alcohol related problems. Score 20 or above:  warrants further diagnostic evaluation for alcohol dependence and treatment.   CLINICAL FACTORS:  26 year old male, presented following intentional overdose on Clonidine. States overdose was impulsive, and at this time denies suicidal intention. Reports depression related to severe psychosocial stressors, including separation , homelessness, lack of income, poor support network.    Psychiatric Specialty Exam: Physical Exam  ROS  Blood pressure 123/71, pulse 60, temperature 98.1 F (36.7 C), temperature source Oral, resp. rate 18, height  (1.778 m), weight 70.3 kg (155 lb), SpO2 100 %.Body mass index is 22.24 kg/m.   see admit note MSE     COGNITIVE FEATURES THAT CONTRIBUTE TO RISK:  Closed-mindedness and Loss of executive function      SUICIDE RISK:   Moderate:  Frequent suicidal ideation with limited intensity, and duration, some specificity in terms of plans, no associated intent, good self-control, limited  dysphoria/symptomatology, some risk factors present, and identifiable protective factors, including available and accessible social support.  PLAN OF CARE: Patient will be admitted to inpatient psychiatric unit for stabilization and safety. Will provide and encourage milieu participation. Provide medication management and maked adjustments as needed.  Will follow daily.    I certify that inpatient services furnished can reasonably be expected to improve the patient's condition.   Craige Cotta, MD 03/27/2017, 5:36 PM
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
## 2017-03-27 NOTE — Progress Notes (Signed)
DAR NOTE: Patient presents with blunt affect and depressed mood.  Denies pain, auditory and visual hallucinations.  Described energy level as normal and concentration as good.  Rates depression at 0, hopelessness at 0, and anxiety at 0.  Maintained on routine safety checks.  Medications given as prescribed.  Support and encouragement offered as needed.  Attended group and participated.  States goal for today is "getting to job interview on Monday."  Patient is preoccupied with getting discharged before Monday for a job interview.  Offered no complaint.

## 2017-03-27 NOTE — Progress Notes (Signed)
Adult Psychoeducational Group Note  Date:  03/27/2017 Time:  9:43 PM  Group Topic/Focus:  Wrap-Up Group:   The focus of this group is to help patients review their daily goal of treatment and discuss progress on daily workbooks.  Participation Level:  Active  Participation Quality:  Appropriate  Affect:  Appropriate  Cognitive:  Appropriate  Insight: Appropriate  Engagement in Group:  Engaged  Modes of Intervention:  Discussion  Additional Comments: The patient expressed that he rates today 7.The patient also said that he attended support group.  Octavio Manns 03/27/2017, 9:43 PM

## 2017-03-27 NOTE — Tx Team (Signed)
Interdisciplinary Treatment and Diagnostic Plan Update  03/27/2017 Time of Session: 3:55 PM  Chad Hale MRN: [id number]  Principal Diagnosis: <principal problem not specified>  Secondary Diagnoses: Active Problems:   MDD (major depressive disorder)   Current Medications:  Current Facility-Administered Medications  Medication Dose Route Frequency Provider Last Rate Last Dose  . acetaminophen (TYLENOL) tablet 650 mg  650 mg Oral Q6H PRN Kerrie Buffalo, NP      . hydrOXYzine (ATARAX/VISTARIL) tablet 25 mg  25 mg Oral Q6H PRN Kerrie Buffalo, NP   25 mg at 03/27/17 1313  . magnesium hydroxide (MILK OF MAGNESIA) suspension 30 mL  30 mL Oral Daily PRN Kerrie Buffalo, NP   30 mL at 03/26/17 2156  . nicotine (NICODERM CQ - dosed in mg/24 hours) patch 21 mg  21 mg Transdermal Daily Rozetta Nunnery, NP      . traZODone (DESYREL) tablet 100 mg  100 mg Oral QHS PRN Kerrie Buffalo, NP   100 mg at 03/26/17 2157    PTA Medications: No prescriptions prior to admission.    Treatment Modalities: Medication Management, Group therapy, Case management,  1 to 1 session with clinician, Psychoeducation, Recreational therapy.   Physician Treatment Plan for Primary Diagnosis: <principal problem not specified> Long Term Goal(s): Improvement in symptoms so as ready for discharge  Short Term Goals:    Medication Management: Evaluate patient's response, side effects, and tolerance of medication regimen.  Therapeutic Interventions: 1 to 1 sessions, Unit Group sessions and Medication administration.  Evaluation of Outcomes: Progressing  Physician Treatment Plan for Secondary Diagnosis: Active Problems:   MDD (major depressive disorder)   Long Term Goal(s): Improvement in symptoms so as ready for discharge  Short Term Goals:    Medication Management: Evaluate patient's response, side effects, and tolerance of medication regimen.  Therapeutic Interventions: 1 to 1 sessions, Unit Group sessions  and Medication administration.  Evaluation of Outcomes: Progressing   RN Treatment Plan for Primary Diagnosis: <principal problem not specified> Long Term Goal(s): Knowledge of disease and therapeutic regimen to maintain health will improve  Short Term Goals: Ability to disclose and discuss suicidal ideas and Compliance with prescribed medications will improve  Medication Management: RN will administer medications as ordered by provider, will assess and evaluate patient's response and provide education to patient for prescribed medication. RN will report any adverse and/or side effects to prescribing provider.  Therapeutic Interventions: 1 on 1 counseling sessions, Psychoeducation, Medication administration, Evaluate responses to treatment, Monitor vital signs and CBGs as ordered, Perform/monitor CIWA, COWS, AIMS and Fall Risk screenings as ordered, Perform wound care treatments as ordered.  Evaluation of Outcomes: Progressing   LCSW Treatment Plan for Primary Diagnosis: <principal problem not specified> Long Term Goal(s): Safe transition to appropriate next level of care at discharge, Engage patient in therapeutic group addressing interpersonal concerns.  Short Term Goals: Engage patient in aftercare planning with referrals and resources  Therapeutic Interventions: Assess for all discharge needs, 1 to 1 time with Social worker, Explore available resources and support systems, Assess for adequacy in community support network, Educate family and significant other(s) on suicide prevention, Complete Psychosocial Assessment, Interpersonal group therapy.  Evaluation of Outcomes: Met  States he will return to [place], follow up with [facility] there   Progress in Treatment: Attending groups: Yes Participating in groups: Yes Taking medication as prescribed: Yes Toleration medication: Yes, no side effects reported at this time Family/Significant other contact made: No Patient understands  diagnosis: Yes AEB asking for help with feeling  hopeless Discussing patient identified problems/goals with staff: Yes Medical problems stabilized or resolved: Yes Denies suicidal/homicidal ideation: Yes Issues/concerns per patient self-inventory: None Other: N/A  New problem(s) identified: None identified at this time.   New Short Term/Long Term Goal(s): None identified at this time.   Discharge Plan or Barriers:   Reason for Continuation of Hospitalization:   Depression  Medication stabilization   Estimated Length of Stay: 3-5 days  Attendees: Patient: 03/27/2017  3:55 PM  Physician: Neita Garnet, MD 03/27/2017  3:55 PM  Nursing: Hoy Register, RN 03/27/2017  3:55 PM  RN Care Manager: Lars Pinks, RN 03/27/2017  3:55 PM  Social Worker: Ripley Fraise 03/27/2017  3:55 PM  Recreational Therapist: Laretta Bolster  03/27/2017  3:55 PM  Other: Norberto Sorenson 03/27/2017  3:55 PM  Other:  03/27/2017  3:55 PM    Scribe for Treatment Team:  Roque Lias LCSW 03/27/2017 3:55 PM

## 2017-03-28 DIAGNOSIS — F17210 Nicotine dependence, cigarettes, uncomplicated: Secondary | ICD-10-CM

## 2017-03-28 DIAGNOSIS — F332 Major depressive disorder, recurrent severe without psychotic features: Principal | ICD-10-CM

## 2017-03-28 DIAGNOSIS — G4700 Insomnia, unspecified: Secondary | ICD-10-CM

## 2017-03-28 DIAGNOSIS — F419 Anxiety disorder, unspecified: Secondary | ICD-10-CM

## 2017-03-28 NOTE — Progress Notes (Addendum)
Patient ID: Chad Hale, male   DOB: 1991/09/17, 26 y.o.   MRN: [id number]     D: Pt has been appropriate on the unit today, he has attended all groups and engaged in treatment. Pt reported that he felt better and that he did not want to take his medication anymore. Aggie NP made aware, no new orders noted. Pt reported that he did need to speak to the SW so that a letter could be sent to his job. The SW did see patient and assisted with request. Pt reported that his depression was a 0, his hopelessness was a 0, and his anxiety was a 0. Pt reported that he did not know what his goal was for today. Pt reported being negative SI/HI, no AH/VH noted. A: 15 min checks continued for patient safety. R: Pt safety maintained.

## 2017-03-28 NOTE — Progress Notes (Signed)
[facility] MD Progress Note  03/28/2017 3:17 PM Harper Smoker  MRN:  [id number]  Subjective: Koy reports, "I'm improving some. I'm just feeling sleepy"  Objective: Keric is seen, chart reviewed. He is lying down in his bed. He says he is feeling sleepy. He says his mood is improving now that he he is trying to reflect on the reasons for his admssion. He is tolerating his treatment regimen. Denies any adverse effects. He is cooperative with staff, no disruptive behavior. Caylan in encouraged to come out of his & participate in the group sessions. He currently does not appear to be responding to any internal stimuli. Staff continues to provide encouragement & support.  Principal Problem: Major depressive disorder  Diagnosis:   Patient Active Problem List   Diagnosis Date Noted  . MDD (major depressive disorder) [F32.9] 03/26/2017    Priority: High   Total Time spent with patient: 25 minutes  Past Psychiatric History: Major depression.  Past Medical History: History reviewed. No pertinent past medical history. History reviewed. No pertinent surgical history.  Family History: History reviewed. No pertinent family history.  Family Psychiatric  History: See Md's SRA  Social History:  History  Alcohol Use No     History  Drug use: Unknown    Social History   Social History  . Marital status: Single    Spouse name: N/A  . Number of children: N/A  . Years of education: N/A   Social History Main Topics  . Smoking status: Current Every Day Smoker    Packs/day: 1.00  . Smokeless tobacco: Never Used  . Alcohol use No  . Drug use: Unknown  . Sexual activity: Not Asked   Other Topics Concern  . None   Social History Narrative  . None   Additional Social History:   Sleep: Good  Appetite:  Good  Current Medications: Current Facility-Administered Medications  Medication Dose Route Frequency Provider Last Rate Last Dose  . acetaminophen (TYLENOL) tablet 650 mg   650 mg Oral Q6H PRN Adonis Brook, NP      . hydrOXYzine (ATARAX/VISTARIL) tablet 25 mg  25 mg Oral Q6H PRN Adonis Brook, NP   25 mg at 03/27/17 2200  . magnesium hydroxide (MILK OF MAGNESIA) suspension 30 mL  30 mL Oral Daily PRN Adonis Brook, NP   30 mL at 03/26/17 2156  . nicotine (NICODERM CQ - dosed in mg/24 hours) patch 21 mg  21 mg Transdermal Daily Jackelyn Poling, NP      . potassium chloride (K-DUR,KLOR-CON) CR tablet 10 mEq  10 mEq Oral BID Craige Cotta, MD   10 mEq at 03/27/17 1821  . sertraline (ZOLOFT) tablet 50 mg  50 mg Oral Daily Craige Cotta, MD      . traZODone (DESYREL) tablet 100 mg  100 mg Oral QHS PRN Adonis Brook, NP   100 mg at 03/27/17 2200   Lab Results: No results found for this or any previous visit (from the past 48 hour(s)).  Blood Alcohol level:  Lab Results  Component Value Date   ETH <5 03/26/2017   Metabolic Disorder Labs: No results found for: HGBA1C, MPG No results found for: PROLACTIN No results found for: CHOL, TRIG, HDL, CHOLHDL, VLDL, LDLCALC  Physical Findings: AIMS: Facial and Oral Movements Muscles of Facial Expression: None, normal Lips and Perioral Area: None, normal Jaw: None, normal Tongue: None, normal,Extremity Movements Upper (arms, wrists, hands, fingers): None, normal Lower (legs, knees, ankles, toes): None, normal, Trunk Movements  Neck, shoulders, hips: None, normal, Overall Severity Severity of abnormal movements (highest score from questions above): None, normal Incapacitation due to abnormal movements: None, normal Patient's awareness of abnormal movements (rate only patient's report): No Awareness, Dental Status Current problems with teeth and/or dentures?: No Does patient usually wear dentures?: No  CIWA:    COWS:     Musculoskeletal: Strength & Muscle Tone: within normal limits Gait & Station: normal Patient leans: N/A  Psychiatric Specialty Exam: Physical Exam  ROS  Blood pressure 137/68, pulse 67,  temperature 97.8 F (36.6 C), resp. rate 20, height  (1.778 m), weight 70.3 kg (155 lb), SpO2 100 %.Body mass index is 22.24 kg/m.  General Appearance: Casual  Eye Contact:  Fair  Speech:  Clear and Coherent  Volume:  Decreased  Mood:  "My mood is improving"  Affect:  Congruent  Thought Process:  Coherent  Orientation:  Full (Time, Place, and Person)  Thought Content:  Rumination  Suicidal Thoughts:  Denies any thoughts, plans or intent  Homicidal Thoughts:  Denies any thoughts, plan or intent.  Memory:  Immediate;   Good Recent;   Good Remote;   Good  Judgement:  Fair  Insight:  Fair  Psychomotor Activity:  Normal  Concentration:  Concentration: Good and Attention Span: Good  Recall:  [employer] of Knowledge:  Fair  Language:  Good  Akathisia:  Negative  Handed:  Right  AIMS (if indicated):     Assets:  Desire for Improvement  ADL's:  Intact  Cognition:  WNL  Sleep:  Number of Hours: 6.75   Treatment Plan Summary:  Daily contact with patient to assess and evaluate symptoms and progress in treatment and Medication management  03-28-17: Reviewed past medical records & treatment plan.   For Depressed mood/anxiety: Will continue Sertraline 50 mg po daily.  For Anxiety disorder: Will continue Hydroxyzine 25 mg po Q 6 hours prn.  For insomnia: Will continue Trazodone 100 po qhs prn.  Other medical issues & concerns: Will continue will continue to monitor & treat prn  - Continue 15 minutes observation for safety concerns - Encouraged to participate in milieu therapy and group therapy counseling sessions and also work with coping skills -  Develop treatment plan to reduce the need for readmission. -  Psycho-social education regarding self care. - Health care follow up as needed for medical problems. - Restart home medications where appropriate.  Sanjuana Kava, NP, PMHNP, FNP-BC 03/28/2017, 3:17 PM

## 2017-03-28 NOTE — Progress Notes (Signed)
Writer has observed patient up in the dayroom laughing and joking with other peers. At the beginning of the shift he was requesting to be moved to another room because of his roommates problems with flatulence. He was given a two options for room change and declined both and decided to stay in his room. He reports feeling hyper since being moved off the 500 hall. He was informed of available medications. He denies si/hi/a/v hallucinations. Support given and safety maintained on unit with 15 min checks.

## 2017-03-28 NOTE — B[facility] Group Notes (Signed)
[facility] Group Notes:  (Clinical Social Work)  03/28/2017  11:15-12:00PM  Summary of Progress/Problems:   Today's process group involved patients discussing their feelings related to being hospitalized, as well as benefits they see to being in the hospital. We also talked about activities that various patients like to engage in when spring comes around, using that as a springboard to discussing how to stay well and in the community so that they can enjoy doing all their favorite things rather than coming back to the hospital. The patient expressed a primary feeling about being hospitalized is that he is ready to go, feels a lot better now than when he first got here.  He stated he has apparently on his phone been contacted by a company for an interview on Monday and that makes him feel good that he has some employment prospects.  He asked for CSW help in contacting them to inform of hospitalization, ask to reschedule.  Type of Therapy:  Group Therapy - Process  Participation Level:  Active  Participation Quality:  Attentive, Sharing and Supportive  Affect:  Appropriate  Cognitive:  Appropriate  Insight:  Engaged  Engagement in Therapy:  Engaged  Modes of Intervention:  Exploration, Discussion  Ambrose Mantle, LCSW 03/28/2017, 1:28 PM

## 2017-03-29 NOTE — Social Work (Signed)
Clinical Social Work Note - Late entry for 03/28/17  CSW made contact with pt's potential employer to inform that he cannot attend an interview early Monday morning.  A note was put in their system about this, and number/name were given for pt to follow up when he is discharged.  This was passed on to pt.  Ambrose Mantle, LCSW 03/29/2017, 8:59 AM

## 2017-03-29 NOTE — B[facility] Group Notes (Signed)
Adult Therapy Group Note  Date: 03/29/2017  Time:  10:00-11:00AM  Group Topic/Focus: Healthy Support Systems   Building Self Esteem:   The focus of this group was to assist patients in identifying their current healthy supports and unhealthy supports, then discussing how to add additional healthy supports and reduce existing unhealthy ones.    Participation Level:  None  Participation Quality:  Poor - sleeping  Affect:  Irritable  Cognitive:  N/A  Insight:Poor  Engagement in Group:  None  Modes of Intervention:  Discussion and Support  Additional Comments:  The patient expressed that he cannot sleep because of his roommate's flatulence, and refused to go to his room to lie down.  He slept in the chair in the dayroom throughout group.  Lynnell Chad, LCSW 03/29/2017  1:08 PM

## 2017-03-29 NOTE — Progress Notes (Signed)
Adult Psychoeducational Group Note  Date:  03/29/2017 Time:  9:19 PM  Group Topic/Focus:  Wrap-Up Group:   The focus of this group is to help patients review their daily goal of treatment and discuss progress on daily workbooks.  Participation Level:  Active  Participation Quality:  Appropriate, Attentive and Redirectable  Affect:  Appropriate  Cognitive:  Appropriate  Insight: Appropriate  Engagement in Group:  Engaged  Modes of Intervention:  Discussion  Additional Comments:  Pt stated his goal for today was to get discharged and to stay positive. Pt stated they got to go outside and got some fresh air.  [name] 03/29/2017, 9:19 PM

## 2017-03-29 NOTE — Progress Notes (Signed)
Writer spoke with patient 1;1 and he reports having had a good day and was glad to be able to go outside. He reports that his plan once discharged is to go to [place]. His family is here in [place] but he rpeorts that he would rather not stay here in [place]. He reports that he has an interview with [employer] for customer service and his social worker has helped him with a letter since being in the hospital for his first interview. He has been up in the dayroom laughing and talking with peers. Support given ans safety maintained on unit with 15 min checks. He is hopeful to discharge on Monday.

## 2017-03-29 NOTE — Progress Notes (Signed)
[facility] MD Progress Note  03/29/2017 1:50 PM Tomer Chalmers  MRN:  [id number]  Subjective: Patient reports " I was unable to sleep at all last night reports my roommate smells."  Objective: Chavez Rosol is awake, alert and oriented *3. Seen sleeping in the dayroom. Patient reports he was unable to rest on last night. States he is extremely tired today. Denies suicidal or homicidal ideation. Denies auditory or visual hallucination and does not appear to be responding to internal stimuli. Patient reports he is medication compliant without mediation side effects. Patient reports a good appetite. Support, encouragement and reassurance was provided.   Principal Problem: Major depressive disorder  Diagnosis:   Patient Active Problem List   Diagnosis Date Noted  . MDD (major depressive disorder) [F32.9] 03/26/2017   Total Time spent with patient: 25 minutes  Past Psychiatric History: Major depression.  Past Medical History: History reviewed. No pertinent past medical history. History reviewed. No pertinent surgical history.  Family History: History reviewed. No pertinent family history.  Family Psychiatric  History: See Md's SRA  Social History:  History  Alcohol Use No     History  Drug use: Unknown    Social History   Social History  . Marital status: Single    Spouse name: N/A  . Number of children: N/A  . Years of education: N/A   Social History Main Topics  . Smoking status: Current Every Day Smoker    Packs/day: 1.00  . Smokeless tobacco: Never Used  . Alcohol use No  . Drug use: Unknown  . Sexual activity: Not Asked   Other Topics Concern  . None   Social History Narrative  . None   Additional Social History:   Sleep: Fair  Appetite:  Good  Current Medications: Current Facility-Administered Medications  Medication Dose Route Frequency Provider Last Rate Last Dose  . acetaminophen (TYLENOL) tablet 650 mg  650 mg Oral Q6H PRN Adonis Brook, NP   650 mg  at 03/28/17 2308  . hydrOXYzine (ATARAX/VISTARIL) tablet 25 mg  25 mg Oral Q6H PRN Adonis Brook, NP   25 mg at 03/28/17 2210  . magnesium hydroxide (MILK OF MAGNESIA) suspension 30 mL  30 mL Oral Daily PRN Adonis Brook, NP   30 mL at 03/26/17 2156  . traZODone (DESYREL) tablet 100 mg  100 mg Oral QHS PRN Adonis Brook, NP   100 mg at 03/28/17 2210   Lab Results: No results found for this or any previous visit (from the past 48 hour(s)).  Blood Alcohol level:  Lab Results  Component Value Date   ETH <5 03/26/2017   Metabolic Disorder Labs: No results found for: HGBA1C, MPG No results found for: PROLACTIN No results found for: CHOL, TRIG, HDL, CHOLHDL, VLDL, LDLCALC  Physical Findings: AIMS: Facial and Oral Movements Muscles of Facial Expression: None, normal Lips and Perioral Area: None, normal Jaw: None, normal Tongue: None, normal,Extremity Movements Upper (arms, wrists, hands, fingers): None, normal Lower (legs, knees, ankles, toes): None, normal, Trunk Movements Neck, shoulders, hips: None, normal, Overall Severity Severity of abnormal movements (highest score from questions above): None, normal Incapacitation due to abnormal movements: None, normal Patient's awareness of abnormal movements (rate only patient's report): No Awareness, Dental Status Current problems with teeth and/or dentures?: No Does patient usually wear dentures?: No  CIWA:    COWS:     Musculoskeletal: Strength & Muscle Tone: within normal limits Gait & Station: normal Patient leans: N/A  Psychiatric Specialty Exam: Physical Exam  Vitals  reviewed. Neurological: He is alert.  Psychiatric: He has a normal mood and affect. His behavior is normal.    Review of Systems  Psychiatric/Behavioral: Positive for depression. The patient is nervous/anxious.     Blood pressure 116/66, pulse 60, temperature 97.7 F (36.5 C), resp. rate 18, height  (1.778 m), weight 70.3 kg (155 lb), SpO2 100 %.Body  mass index is 22.24 kg/m.  General Appearance: Casual  Eye Contact:  Fair  Speech:  Clear and Coherent  Volume:  Decreased  Mood:  Anxious, Depressed and Irritable  Affect:  Congruent  Thought Process:  Coherent  Orientation:  Full (Time, Place, and Person)  Thought Content:  Rumination  Suicidal Thoughts:  No  Homicidal Thoughts:  No  Memory:  Immediate;   Fair Remote;   Good  Judgement:  Fair  Insight:  Fair  Psychomotor Activity:  Normal  Concentration:  Concentration: Good  Recall:  Fair  Fund of Knowledge:  Fair  Language:  Good  Akathisia:  Negative  Handed:  Right  AIMS (if indicated):     Assets:  Desire for Improvement  ADL's:  Intact  Cognition:  WNL  Sleep:  Number of Hours: 6     I agree with current treatment plan on 03/29/2017, Patient seen face-to-face for psychiatric evaluation follow-up, chart reviewed. Reviewed the information documented and agree with the treatment plan.   Treatment Plan Summary:  Daily contact with patient to assess and evaluate symptoms and progress in treatment and Medication management  Continue with current treatment plan on 03/29/2017 as listed below  For Depressed mood/anxiety: Will continue Sertraline 50 mg po daily.  For Anxiety disorder: Will continue Hydroxyzine 25 mg po Q 6 hours prn.  For insomnia: Will continue Trazodone 100 po qhs prn.  Other medical issues & concerns: Will continue will continue to monitor & treat prn  - Continue 15 minutes observation for safety concerns - Encouraged to participate in milieu therapy and group therapy counseling sessions and also work with coping skills -  Develop treatment plan to reduce the need for readmission. -  Psycho-social education regarding self care. - Health care follow up as needed for medical problems. - Restart home medications where appropriate.  Oneta Rack, NP 03/29/2017, 1:50 PM

## 2017-03-29 NOTE — Progress Notes (Addendum)
[name] is seen out on the milieu today. He is quiet and isolative until he went outside at 1500 ( for recreation ) ... Seen laughing and watching TV with his peers  This afternoon as well as taking on the hall telephone. AHE  Completes his daily assessment  And on this he writes he deneis SI today and he rates his depression, hopelessness and  Anxiety " 0/0/0/", respectively. R Safety in palce Pt is encouraged to cont to process his feelings and his thoughts..practice new healthy coping skills and do daily workbook homwork.Marland Kitchen

## 2017-03-30 MED ORDER — SERTRALINE HCL 50 MG PO TABS
50.0000 mg | ORAL_TABLET | Freq: Every day | ORAL | Status: DC
Start: 2017-03-30 — End: 2017-03-31
  Administered 2017-03-30: 50 mg via ORAL
  Filled 2017-03-30 (×4): qty 1

## 2017-03-30 MED ORDER — TRAZODONE HCL 100 MG PO TABS
100.0000 mg | ORAL_TABLET | Freq: Once | ORAL | Status: AC
  Administered 2017-03-30: 100 mg via ORAL
  Filled 2017-03-30 (×2): qty 1

## 2017-03-30 NOTE — Progress Notes (Signed)
Recreation Therapy Notes  Date: 03/30/17 Time: 0930 Location: 300 Hall Dayroom  Group Topic: Stress Management  Goal Area(s) Addresses:  Patient will verbalize importance of using healthy stress management.  Patient will identify positive emotions associated with healthy stress management.   Intervention: Stress Management  Activity :  LRT introduced the stress management technique of guided imagery.  LRT read a script to engage patients in guided imagery.  Patients were to follow along as the script was read in order to fully participate in the technique.  Education:  Stress Management, Discharge Planning.   Education Outcome: Acknowledges edcuation/In group clarification offered/Needs additional education  Clinical Observations/Feedback: Pt did not attend group.   Caroll Rancher, LRT/CTRS         Caroll Rancher A 03/30/2017 12:09 PM

## 2017-03-30 NOTE — [facility] Group Notes (Signed)
[facility] LCSW Aftercare Discharge Planning Group Note   03/30/2017 12:33 PM  Participation Quality:  Pt was present for group but was asleep the entire time.   Jonathon Jordan, MSW, Theresia Majors

## 2017-03-30 NOTE — Plan of Care (Signed)
Problem: Safety: Goal: Periods of time without injury will increase Outcome: Progressing Pt safe on the unit at this time   

## 2017-03-30 NOTE — Progress Notes (Signed)
[facility] MD Progress Note  03/30/2017 3:36 PM [place]  MRN:  [id number]  Subjective: Patient reports he is feeling better, and is currently focusing on discharging soon. He is future oriented, and interested in relocating to [place]. Patient explains that he is currently homeless and that he has undergone prior episodes of homelessness in [place] so he feels he has better knowledge of available shelters and services for the homeless there . In  Addition, states he has a job interview in [place] coming up, and is hopeful he will get a job there soon. Denies medication side effects.    Objective:  I have discussed case with treatment team and have met with patient . Reports feeling better than on admission . He minimizes current depression or neuro-vegetative symptoms, denies suicidal ideations. Behavior on unit calm and in good control. Denies medication side effects.  He has been visible in day room and going to some groups .   Principal Problem: Major depressive disorder  Diagnosis:   Patient Active Problem List   Diagnosis Date Noted  . MDD (major depressive disorder) [F32.9] 03/26/2017   Total Time spent with patient: 20 minutes  Past Psychiatric History: Major depression.  Past Medical History: History reviewed. No pertinent past medical history. History reviewed. No pertinent surgical history.  Family History: History reviewed. No pertinent family history.  Family Psychiatric  History: See Md's SRA  Social History:  History  Alcohol Use No     History  Drug use: Unknown    Social History   Social History  . Marital status: Single    Spouse name: N/A  . Number of children: N/A  . Years of education: N/A   Social History Main Topics  . Smoking status: Current Every Day Smoker    Packs/day: 1.00  . Smokeless tobacco: Never Used  . Alcohol use No  . Drug use: Unknown  . Sexual activity: Not Asked   Other Topics Concern  . None   Social History  Narrative  . None   Additional Social History:   Sleep: Good  Appetite:  Good  Current Medications: Current Facility-Administered Medications  Medication Dose Route Frequency Provider Last Rate Last Dose  . acetaminophen (TYLENOL) tablet 650 mg  650 mg Oral Q6H PRN Kerrie Buffalo, NP   650 mg at 03/28/17 2308  . hydrOXYzine (ATARAX/VISTARIL) tablet 25 mg  25 mg Oral Q6H PRN Kerrie Buffalo, NP   25 mg at 03/30/17 0738  . magnesium hydroxide (MILK OF MAGNESIA) suspension 30 mL  30 mL Oral Daily PRN Kerrie Buffalo, NP   30 mL at 03/26/17 2156  . traZODone (DESYREL) tablet 100 mg  100 mg Oral QHS PRN Kerrie Buffalo, NP   100 mg at 03/29/17 2225   Lab Results: No results found for this or any previous visit (from the past 48 hour(s)).  Blood Alcohol level:  Lab Results  Component Value Date   ETH <5 57/84/6962   Metabolic Disorder Labs: No results found for: HGBA1C, MPG No results found for: PROLACTIN No results found for: CHOL, TRIG, HDL, CHOLHDL, VLDL, LDLCALC  Physical Findings: AIMS: Facial and Oral Movements Muscles of Facial Expression: None, normal Lips and Perioral Area: None, normal Jaw: None, normal Tongue: None, normal,Extremity Movements Upper (arms, wrists, hands, fingers): None, normal Lower (legs, knees, ankles, toes): None, normal, Trunk Movements Neck, shoulders, hips: None, normal, Overall Severity Severity of abnormal movements (highest score from questions above): None, normal Incapacitation due to abnormal movements: None, normal Patient's  awareness of abnormal movements (rate only patient's report): No Awareness, Dental Status Current problems with teeth and/or dentures?: No Does patient usually wear dentures?: No  CIWA:  CIWA-Ar Total: 1 COWS:  COWS Total Score: 0  Musculoskeletal: Strength & Muscle Tone: within normal limits Gait & Station: normal Patient leans: N/A  Psychiatric Specialty Exam: Physical Exam  Vitals reviewed. Neurological: He  is alert.  Psychiatric: He has a normal mood and affect. His behavior is normal.    Review of Systems  Psychiatric/Behavioral: Positive for depression. The patient is nervous/anxious.   denies headache, denies chest pain, no shortness of breath   Blood pressure 115/75, pulse 62, temperature 97.4 F (36.3 C), temperature source Oral, resp. rate 16, height _0  (1.778 m), weight 70.3 kg (155 lb), SpO2 100 %.Body mass index is 22.24 kg/m.  General Appearance: improved grooming   Eye Contact:  Good  Speech:  Normal Rate  Volume:  Normal  Mood:  improved mood , feeling better  Affect:  Appropriate  Thought Process:  Linear and Descriptions of Associations: Intact  Orientation:  Full (Time, Place, and Person)  Thought Content:  denies hallucinations, no delusions, not internally preoccupied   Suicidal Thoughts:  No denies suicidal or self injurious ideations, denies homicidal or violent ideations   Homicidal Thoughts:  No  Memory:  Recent and remote grossly intact   Judgement:  Other:  improving   Insight:  improving   Psychomotor Activity:  Normal  Concentration:  Concentration: Good and Attention Span: Good  Recall:  Good  Fund of Knowledge:  Good  Language:  Good  Akathisia:  Negative  Handed:  Right  AIMS (if indicated):     Assets:  Desire for Improvement Resilience  ADL's:  Intact  Cognition:  WNL  Sleep:  Number of Hours: 6.25   Assessment - Patient presents improved compared to admission - at this time reports improved mood , minimizes depression, denies any lingering suicidal ideations, and is future oriented, focusing on relocating to [place] soon after discharge. Denies medication side effects .  Treatment Plan Summary:   Treatment plan reviewed as below today 4/2.   For Depressed mood/anxiety: Will continue Sertraline 50 mg po daily.  For Anxiety disorder: Will continue Hydroxyzine 25 mg po Q 6 hours prn.  For insomnia: Will continue Trazodone 100 po qhs  prn.  Treatment team working on disposition plan as above  Jenne Campus, MD 03/30/2017, 3:36 PM   Patient ID: [place], male   DOB: [birth year], 26 y.o.   MRN: [id number]

## 2017-03-30 NOTE — Progress Notes (Signed)
D:  Patient's self inventory sheet, patient has good sleep, no sleep medication.  Good appetite, high energy level, good concentration.  Denied depression, hopeless and anxiety.  Withdrawals, cravings, irritability.  Denied SI.  Denied physical problems.  Denied pain.  Goal is to stay positive.  Plans to have fun.  "Great job!"  Does have discharge plans. A:  Medications administered per MD orders.  Emotional support and encouragement given patient. R:  Denied SI and HI, contracts for safety.  Denied A/V hallucinations.  Safety maintained with 15 minute checks.

## 2017-03-30 NOTE — Progress Notes (Signed)
D: Pt denies SI/HI/AVH. Pt is pleasant and cooperative. Pt stated he was feeling better today.   A: Pt was offered support and encouragement. Pt was given scheduled medications. Pt was encourage to attend groups. Q 15 minute checks were done for safety.    R:Pt attends groups and interacts well with peers and staff. Pt is taking medication. Pt has no complaints.Pt receptive to treatment and safety maintained on unit.

## 2017-03-30 NOTE — Progress Notes (Signed)
Adult Psychoeducational Group Note  Date:  03/30/2017 Time:  9:13 PM  Group Topic/Focus:  Wrap-Up Group:   The focus of this group is to help patients review their daily goal of treatment and discuss progress on daily workbooks.  Participation Level:  Active  Participation Quality:  Appropriate and Attentive  Affect:  Appropriate  Cognitive:  Appropriate  Insight: Appropriate  Engagement in Group:  Engaged  Modes of Intervention:  Discussion  Additional Comments:  Pt stated his goal was to not die today. Pt stated he is hopeful to discharge tomorrow.  [name] 03/30/2017, 9:13 PM

## 2017-03-30 NOTE — Plan of Care (Signed)
Problem: Education: Goal: Utilization of techniques to improve thought processes will improve Outcome: Progressing Nurse discussed depression/anxiety with patient.    

## 2017-03-30 NOTE — [facility] Group Notes (Signed)
[facility] LCSW Group Therapy  03/30/2017 1:15pm  Type of Therapy: Group Therapy   Topic: Overcoming Obstacles  Participation Level: Active  Participation Quality: Appropriate   Affect: Appropriate  Cognitive: Appropriate and Oriented  Insight: Developing/Improving and Improving  Engagement in Therapy: Improving  Modes of Intervention: Discussion, Exploration, Problem-solving and Support  Description of Group:  In this group patients will be encouraged to explore what they see as obstacles to their own wellness and recovery. They will be guided to discuss their thoughts, feelings, and behaviors related to these obstacles. The group will process together ways to cope with barriers, with attention given to specific choices patients can make. Each patient will be challenged to identify changes they are motivated to make in order to overcome their obstacles. This group will be process-oriented, with patients participating in exploration of their own experiences as well as giving and receiving support and challenge from other group members.  Summary of Patient Progress:  Pt states that his biggest obstacle currently is finding a job. Pt would like to secure a job after he discharges from the hospital because it would help with his money flow problems.  Therapeutic Modalities:  Cognitive Behavioral Therapy Solution Focused Therapy Motivational Interviewing Relapse Prevention Therapy  Jonathon Jordan, MSW, Theresia Majors [phone]

## 2017-03-31 DIAGNOSIS — T50901A Poisoning by unspecified drugs, medicaments and biological substances, accidental (unintentional), initial encounter: Secondary | ICD-10-CM

## 2017-03-31 MED ORDER — ESCITALOPRAM OXALATE 5 MG PO TABS
5.0000 mg | ORAL_TABLET | Freq: Every day | ORAL | 0 refills | Status: AC
Start: 2017-04-01 — End: ?

## 2017-03-31 MED ORDER — ESCITALOPRAM OXALATE 5 MG PO TABS
5.0000 mg | ORAL_TABLET | Freq: Every day | ORAL | Status: DC
  Administered 2017-03-31: 5 mg via ORAL
  Filled 2017-03-31: qty 1
  Filled 2017-03-31: qty 7
  Filled 2017-03-31: qty 1

## 2017-03-31 MED ORDER — HYDROXYZINE HCL 25 MG PO TABS: 25.0000 mg | ORAL_TABLET | Freq: Four times a day (QID) | ORAL | 0 refills | Status: AC | PRN

## 2017-03-31 MED ORDER — TRAZODONE HCL 100 MG PO TABS: 100.0000 mg | ORAL_TABLET | Freq: Every evening | ORAL | 0 refills | Status: AC | PRN

## 2017-03-31 NOTE — Discharge Summary (Signed)
Physician Discharge Summary Note  Patient:  Chad Hale is an 26 y.o., male MRN:  [id number] DOB:  1991-11-04 Patient phone:  [phone] (home)  Patient address:   [address] [place] [address],  Total Time spent with patient: 30 minutes  Date of Admission:  03/26/2017 Date of Discharge: 03/31/2017  Reason for Admission:  Suicidal ideation with plan to overdose  Principal Problem: MDD (major depressive disorder) Discharge Diagnoses: Patient Active Problem List   Diagnosis Date Noted  . MDD (major depressive disorder) [F32.9] 03/26/2017    Past Psychiatric History:  See HPI  Past Medical History: History reviewed. No pertinent past medical history. History reviewed. No pertinent surgical history. Family History: History reviewed. No pertinent family history. Family Psychiatric  History: see HPI Social History:  History  Alcohol Use No     History  Drug use: Unknown    Social History   Social History  . Marital status: Single    Spouse name: N/A  . Number of children: N/A  . Years of education: N/A   Social History Main Topics  . Smoking status: Current Every Day Smoker    Packs/day: 1.00  . Smokeless tobacco: Never Used  . Alcohol use No  . Drug use: Unknown  . Sexual activity: Not Asked   Other Topics Concern  . None   Social History Narrative  . None    Hospital Course:  Avery Klingbeil is an 26 y.o. male that reports SI with a plan to overdose.  Patient reports increased depressed associated with being separated from his wife.  Amadi Frady was admitted for MDD (major depressive disorder) and crisis management.  Patient was treated with medications with their indications listed below in detail under Medication List.  Medical problems were identified and treated as needed.  Home medications were restarted as appropriate.  Improvement was monitored by observation and Governor Rooks daily report of symptom reduction.  Emotional and mental  status was monitored by daily self inventory reports completed by Governor Rooks and clinical staff.  Patient reported continued improvement, denied any new concerns.  Patient had been compliant on medications and denied side effects.  Support and encouragement was provided.    Patient encouraged to attend groups to help with recognizing triggers of emotional crises and de-stabilizations.  Patient encouraged to attend group to help identify the positive things in life that would help in dealing with feelings of loss, depression and unhealthy or abusive tendencies.         Marcellino Fidalgo was evaluated by the treatment team for stability and plans for continued recovery upon discharge.  Patient was offered further treatment options upon discharge including Residential, Intensive Outpatient and Outpatient treatment. Patient will follow up with agency listed below for medication management and counseling.  Encouraged patient to maintain satisfactory support network and home environment.  Advised to adhere to medication compliance and outpatient treatment follow up.  Prescriptions provided.       Marquee Fuchs motivation was an integral factor for scheduling further treatment.  Employment, transportation, bed availability, health status, family support, and any pending legal issues were also considered during patient's hospital stay.  Upon completion of this admission the patient was both mentally and medically stable for discharge denying suicidal/homicidal ideation, auditory/visual/tactile hallucinations, delusional thoughts and paranoia.      Physical Findings: AIMS: Facial and Oral Movements Muscles of Facial Expression: None, normal Lips and Perioral Area: None, normal Jaw: None, normal Tongue: None, normal,Extremity Movements Upper (arms, wrists, hands,  fingers): None, normal Lower (legs, knees, ankles, toes): None, normal, Trunk Movements Neck, shoulders, hips: None, normal, Overall  Severity Severity of abnormal movements (highest score from questions above): None, normal Incapacitation due to abnormal movements: None, normal Patient's awareness of abnormal movements (rate only patient's report): No Awareness, Dental Status Current problems with teeth and/or dentures?: No Does patient usually wear dentures?: No  CIWA:  CIWA-Ar Total: 1 COWS:  COWS Total Score: 0  Musculoskeletal: Strength & Muscle Tone: within normal limits Gait & Station: normal Patient leans: N/A  Psychiatric Specialty Exam: Physical Exam  Nursing note and vitals reviewed.   ROS  Blood pressure 116/71, pulse (!) 59, temperature 97.4 F (36.3 C), temperature source Oral, resp. rate 18, height  (1.778 m), weight 70.3 kg (155 lb), SpO2 100 %.Body mass index is 22.24 kg/m.   Have you used any form of tobacco in the last 30 days? (Cigarettes, Smokeless Tobacco, Cigars, and/or Pipes): Yes  Has this patient used any form of tobacco in the last 30 days? (Cigarettes, Smokeless Tobacco, Cigars, and/or Pipes) Yes, N/A  Blood Alcohol level:  Lab Results  Component Value Date   ETH <5 03/26/2017    Metabolic Disorder Labs:  No results found for: HGBA1C, MPG No results found for: PROLACTIN No results found for: CHOL, TRIG, HDL, CHOLHDL, VLDL, LDLCALC  See Psychiatric Specialty Exam and Suicide Risk Assessment completed by Attending Physician prior to discharge.  Discharge destination:  Home  Is patient on multiple antipsychotic therapies at discharge:  No   Has Patient had three or more failed trials of antipsychotic monotherapy by history:  No  Recommended Plan for Multiple Antipsychotic Therapies: NA   Allergies as of 03/31/2017      Reactions   Shellfish Allergy Anaphylaxis      Medication List    TAKE these medications     Indication  escitalopram 5 MG tablet Commonly known as:  LEXAPRO Take 1 tablet (5 mg total) by mouth daily. Start taking on:  04/01/2017  Indication:   Major Depressive Disorder   hydrOXYzine 25 MG tablet Commonly known as:  ATARAX/VISTARIL Take 1 tablet (25 mg total) by mouth every 6 (six) hours as needed for anxiety.  Indication:  Anxiety Neurosis   traZODone 100 MG tablet Commonly known as:  DESYREL Take 1 tablet (100 mg total) by mouth at bedtime as needed for sleep.  Indication:  Trouble Sleeping        Follow-up recommendations:  Activity:  as tol Diet:  as tol  Comments:  1.  Take all your medications as prescribed.   2.  Report any adverse side effects to outpatient provider. 3.  Patient instructed to not use alcohol or illegal drugs while on prescription medicines. 4.  In the event of worsening symptoms, instructed patient to call 911, the crisis hotline or go to nearest emergency room for evaluation of symptoms.  Signed: Lindwood Qua, NP [facility] 03/31/2017, 10:17 AM   Patient seen, Suicide Assessment Completed.  Disposition Plan Reviewed

## 2017-03-31 NOTE — BHH Group Notes (Signed)
Pt attended spiritual care group on grief and loss facilitated by chaplain Burnis Kingfisher   Group opened with brief discussion and psycho-social ed around grief and loss in relationships and in relation to self - identifying life patterns, circumstances, changes that cause losses. Established group norm of speaking from own life experience. Group goal of establishing open and affirming space for members to share loss and experience with grief, normalize grief experience and provide psycho social education and grief support.    Chad Hale was present throughout group.  Alert and oriented, he was engaged in group discussion.  Spoke with other group members about grief around lost relationship.  Briefly described losing relationship with a woman with whom he had been involved for 9 years.  Described feeling unable to express grief about changes in relationship while with her, due to the role he played in caring for her.  Found resonance with other group members. And offered support when group spoke about resources for managing grief outside of the hospital.  Spoke specifically about experiencing judgment from family members, but being able to determine how he wanted to receive that.    Belva Crome MDiv

## 2017-03-31 NOTE — Progress Notes (Signed)
CSW was unable to get approval for pt to have a bus ticket to [place]. Pt will be discharging to [place]. CSW made a PATH referral on the pt's behalf and a representative from [facility] came to speak with the pt today. Pt will be connected to PATH services upon d/c and should meet his PATH caseworker at the [facility] tomorrow morning.   Jonathon Jordan, MSW, Theresia Majors [phone]

## 2017-03-31 NOTE — Progress Notes (Signed)
  [facility] Adult Case Management Discharge Plan :  Will you be returning to the same living situation after discharge:  Yes,  pt returning to [place]. At discharge, do you have transportation home?: Yes,  bus passes provided. Do you have the ability to pay for your medications: Yes,  prescriptions and samples provided.  Release of information consent forms completed and in the chart;  Patient's signature needed at discharge.  Patient to Follow up at: Follow-up Information    [facility] Follow up.   Specialty:  Behavioral Health Why:  Please go for a walk-in appointment within 1-3 days of discharge to be established for outpatient services. Walk-in hours are Mon-Fri 8AM-3PM. Please arrive as early as possible to be sure that you are seen. Contact information: [address] [place] [address] [phone]        [facility] Follow up on 04/01/2017.   Why:  Appointment with Trinna Post to be connected to services. Please arrive in the morning any time after 9AM. Thank you. Contact information: [address] [place] [address] [phone]           Next level of care provider has access to [facility] Link:no  Safety Planning and Suicide Prevention discussed: Yes,  with pt.  Have you used any form of tobacco in the last 30 days? (Cigarettes, Smokeless Tobacco, Cigars, and/or Pipes): Yes  Has patient been referred to the Quitline?: Patient refused referral  Patient has been referred for addiction treatment: Yes  Chad Hale 03/31/2017, 3:14 PM

## 2017-03-31 NOTE — BHH Group Notes (Signed)
The focus of this group is to educate the patient on the purpose and policies of crisis stabilization and provide a format to answer questions about their admission.  The group details unit policies and expectations of patients while admitted.  Patient attended 0900 nurse education orientation group this morning.  Patient actively participated and had appropriate affect.  Patient was alert.  Patient had appropriate inisight and appropriate engagement.  Today patient will work on 3 goals for discharge.   

## 2017-03-31 NOTE — BHH Suicide Risk Assessment (Addendum)
[facility] Discharge Suicide Risk Assessment   Principal Problem: MDD (major depressive disorder) Discharge Diagnoses:  Patient Active Problem List   Diagnosis Date Noted  . MDD (major depressive disorder) [F32.9] 03/26/2017    Total Time spent with patient: 30 minutes  Musculoskeletal: Strength & Muscle Tone: within normal limits Gait & Station: normal Patient leans: N/A  Psychiatric Specialty Exam: ROS  Denies chest pain, no shortness of breath, no vomiting, no fever   Blood pressure 116/71, pulse (!) 59, temperature 97.4 F (36.3 C), temperature source Oral, resp. rate 18, height  (1.778 m), weight 70.3 kg (155 lb), SpO2 100 %.Body mass index is 22.24 kg/m.  General Appearance: improved grooming   Eye Contact::  Good  Speech:  Normal Rate409  Volume:  Normal  Mood:  improved, presents euthymic, affect appropriate  Affect:  appropriate, reactive   Thought Process:  Goal Directed and Descriptions of Associations: Intact  Orientation:  Full (Time, Place, and Person)  Thought Content:  denies hallucinations, no delusions, not internally preoccupied   Suicidal Thoughts:  No denies any suicidal or self injurious ideations, denies any homicidal or violent ideations, specifically also denies any violent ideations towards his wife  Homicidal Thoughts:  No  Memory:  recent and remote grossly intact   Judgement:  Other:  improving   Insight:  fair- improving   Psychomotor Activity:  Normal  Concentration:  Good  Recall:  Good  Fund of Knowledge:Good  Language: Good  Akathisia:  Negative  Handed:  Right  AIMS (if indicated):     Assets:  Desire for Improvement Resilience Social Support  Sleep:  Number of Hours: 6.25  Cognition: WNL  ADL's:  Intact   Mental Status Per Nursing Assessment::   On Admission:  Self-harm thoughts  Demographic Factors:  26 year old married male, separated, currently homeless  Loss Factors: Separation, homelessness, current  unemployment  Historical Factors: History of depression, history of a prior psychiatric admission, history of prior O/D in the past   Risk Reduction Factors:   Resilience, coping skills   Continued Clinical Symptoms:  At this time patient is improved compared to admission - presents alert ,attentive, calm, pleasant, mood reported as normal, denies depression at this time, affect is more reactive, not irritable at this time, no thought disorder, no suicidal or self injurious ideations, no homicidal or violent ideations, no hallucinations, no delusions, future oriented. Plans to go to [place], [place] soon as he has more knowledge of resources for homeless persons there, and has a job interview there soon Of note, states he does not feel he is tolerating Zoloft well, as he feels vaguely nauseous and overmedicated on it. He does want to try another antidepressant, and we discussed options . Agrees to switch from Zoloft to Lexapro 5 mgrs QAM . Side effects reviewed .  Cognitive Features That Contribute To Risk:  No gross cognitive deficits noted upon discharge. Is alert , attentive, and oriented x 3   Suicide Risk:  Mild:  Suicidal ideation of limited frequency, intensity, duration, and specificity.  There are no identifiable plans, no associated intent, mild dysphoria and related symptoms, good self-control (both objective and subjective assessment), few other risk factors, and identifiable protective factors, including available and accessible social support.    Plan Of Care/Follow-up recommendations:  Activity:  as tolerated  Diet:  Regular Tests:  NA Other:  See below  Patient is requesting discharge and there are no current grounds for involuntary commitment  He is leaving  unit in good spirits  Plans to relocate to [place] later this week and plans to reside in a shelter and to get outpatient psychiatric services there   Craige Cotta, MD 03/31/2017, 10:52 AM

## 2017-03-31 NOTE — Progress Notes (Signed)
Discharge note: Pt received both written and verbal discharge instructions. Pt verbalized understanding of discharge instructions. Pt agreed to f/u appt and med regimen. Pt received sample meds, prescriptions, SRA, AVS and transitional record. Pt gathered belongings from room and locker. Pt safely discharged to the lobby  Security returned pt money to him from the safe.

## 2017-03-31 NOTE — Tx Team (Signed)
Interdisciplinary Treatment and Diagnostic Plan Update  03/31/2017 Time of Session: 3:26 PM  Chad Hale MRN: [id number]  Principal Diagnosis: MDD (major depressive disorder)  Secondary Diagnoses: Principal Problem:   MDD (major depressive disorder) Active Problems:   Drug overdose   Current Medications:  Current Facility-Administered Medications  Medication Dose Route Frequency Provider Last Rate Last Dose  . acetaminophen (TYLENOL) tablet 650 mg  650 mg Oral Q6H PRN Adonis Brook, NP   650 mg at 03/28/17 2308  . escitalopram (LEXAPRO) tablet 5 mg  5 mg Oral Daily Rockey Situ Cobos, MD   5 mg at 03/31/17 1009  . hydrOXYzine (ATARAX/VISTARIL) tablet 25 mg  25 mg Oral Q6H PRN Adonis Brook, NP   25 mg at 03/31/17 0820  . magnesium hydroxide (MILK OF MAGNESIA) suspension 30 mL  30 mL Oral Daily PRN Adonis Brook, NP   30 mL at 03/26/17 2156  . traZODone (DESYREL) tablet 100 mg  100 mg Oral QHS PRN Adonis Brook, NP   100 mg at 03/30/17 2118    PTA Medications: No prescriptions prior to admission.    Treatment Modalities: Medication Management, Group therapy, Case management,  1 to 1 session with clinician, Psychoeducation, Recreational therapy.   Physician Treatment Plan for Primary Diagnosis: MDD (major depressive disorder) Long Term Goal(s): Improvement in symptoms so as ready for discharge  Short Term Goals: Ability to verbalize feelings will improve Ability to disclose and discuss suicidal ideas Ability to demonstrate self-control will improve Ability to identify and develop effective coping behaviors will improve Ability to disclose and discuss suicidal ideas Ability to demonstrate self-control will improve Ability to identify and develop effective coping behaviors will improve Ability to maintain clinical measurements within normal limits will improve Compliance with prescribed medications will improve  Medication Management: Evaluate patient's response, side  effects, and tolerance of medication regimen.  Therapeutic Interventions: 1 to 1 sessions, Unit Group sessions and Medication administration.  Evaluation of Outcomes: Adequate for Discharge  Physician Treatment Plan for Secondary Diagnosis: Principal Problem:   MDD (major depressive disorder) Active Problems:   Drug overdose   Long Term Goal(s): Improvement in symptoms so as ready for discharge  Short Term Goals: Ability to verbalize feelings will improve Ability to disclose and discuss suicidal ideas Ability to demonstrate self-control will improve Ability to identify and develop effective coping behaviors will improve Ability to disclose and discuss suicidal ideas Ability to demonstrate self-control will improve Ability to identify and develop effective coping behaviors will improve Ability to maintain clinical measurements within normal limits will improve Compliance with prescribed medications will improve  Medication Management: Evaluate patient's response, side effects, and tolerance of medication regimen.  Therapeutic Interventions: 1 to 1 sessions, Unit Group sessions and Medication administration.  Evaluation of Outcomes: Adequate for Discharge   RN Treatment Plan for Primary Diagnosis: MDD (major depressive disorder) Long Term Goal(s): Knowledge of disease and therapeutic regimen to maintain health will improve  Short Term Goals: Ability to disclose and discuss suicidal ideas and Compliance with prescribed medications will improve  Medication Management: RN will administer medications as ordered by provider, will assess and evaluate patient's response and provide education to patient for prescribed medication. RN will report any adverse and/or side effects to prescribing provider.  Therapeutic Interventions: 1 on 1 counseling sessions, Psychoeducation, Medication administration, Evaluate responses to treatment, Monitor vital signs and CBGs as ordered, Perform/monitor  CIWA, COWS, AIMS and Fall Risk screenings as ordered, Perform wound care treatments as ordered.  Evaluation of Outcomes:  Adequate for Discharge   LCSW Treatment Plan for Primary Diagnosis: MDD (major depressive disorder) Long Term Goal(s): Safe transition to appropriate next level of care at discharge, Engage patient in therapeutic group addressing interpersonal concerns.  Short Term Goals: Engage patient in aftercare planning with referrals and resources  Therapeutic Interventions: Assess for all discharge needs, 1 to 1 time with Social worker, Explore available resources and support systems, Assess for adequacy in community support network, Educate family and significant other(s) on suicide prevention, Complete Psychosocial Assessment, Interpersonal group therapy.  Evaluation of Outcomes: Adequate for Discharge    Progress in Treatment: Attending groups: Yes Participating in groups: Yes Taking medication as prescribed: Yes Toleration medication: Yes, no side effects reported at this time Family/Significant other contact made: No Patient understands diagnosis: Yes AEB pt's willingness to participate in treatment. Discussing patient identified problems/goals with staff: Yes Medical problems stabilized or resolved: Yes Denies suicidal/homicidal ideation: Yes Issues/concerns per patient self-inventory: None Other: N/A  New problem(s) identified: None identified at this time.   New Short Term/Long Term Goal(s): None identified at this time.   Discharge Plan or Barriers: 03/31/17: Pt is discharging to [facility] and will follow-up outpatient with [facility]. Pt has also been connected with the PATH program.   Reason for Continuation of Hospitalization:   None identified at this time.  Estimated Length of Stay: 0 days  Attendees: Patient: 03/31/2017  3:26 PM  Physician: Nehemiah Massed, MD 03/31/2017  3:26 PM  Nursing: Sula Soda 03/31/2017  3:26 PM  RN Care Manager: Onnie Boer, RN  03/31/2017  3:26 PM  Social Worker: Donnelly Stager, LCSWA 03/31/2017  3:26 PM  Recreational Therapist: Royston Cowper  03/31/2017  3:26 PM  Other: 03/31/2017  3:26 PM  Other:  03/31/2017  3:26 PM    Scribe for Treatment Team:  Jonathon Jordan, MSW, LCSWA  03/31/2017 3:26 PM

## 2017-04-16 ENCOUNTER — Emergency Department (HOSPITAL_COMMUNITY): Payer: Self-pay

## 2017-04-16 ENCOUNTER — Emergency Department (HOSPITAL_COMMUNITY)
Admission: EM | Admit: 2017-04-16 | Discharge: 2017-04-16 | Disposition: A | Discharge status: Home / Self Care | Payer: Self-pay | Attending: Emergency Medicine | Admitting: Emergency Medicine

## 2017-04-16 ENCOUNTER — Encounter (HOSPITAL_COMMUNITY): Payer: Self-pay | Admitting: *Deleted

## 2017-04-16 DIAGNOSIS — Y999 Unspecified external cause status: Secondary | ICD-10-CM | POA: Insufficient documentation

## 2017-04-16 DIAGNOSIS — Z79899 Other long term (current) drug therapy: Secondary | ICD-10-CM | POA: Insufficient documentation

## 2017-04-16 DIAGNOSIS — Z23 Encounter for immunization: Secondary | ICD-10-CM | POA: Insufficient documentation

## 2017-04-16 DIAGNOSIS — Y92410 Unspecified street and highway as the place of occurrence of the external cause: Secondary | ICD-10-CM | POA: Insufficient documentation

## 2017-04-16 DIAGNOSIS — T148XXA Other injury of unspecified body region, initial encounter: Secondary | ICD-10-CM

## 2017-04-16 DIAGNOSIS — F17200 Nicotine dependence, unspecified, uncomplicated: Secondary | ICD-10-CM | POA: Insufficient documentation

## 2017-04-16 DIAGNOSIS — Y9302 Activity, running: Secondary | ICD-10-CM | POA: Insufficient documentation

## 2017-04-16 DIAGNOSIS — W010XXA Fall on same level from slipping, tripping and stumbling without subsequent striking against object, initial encounter: Secondary | ICD-10-CM | POA: Insufficient documentation

## 2017-04-16 DIAGNOSIS — S50811A Abrasion of right forearm, initial encounter: Secondary | ICD-10-CM | POA: Insufficient documentation

## 2017-04-16 DIAGNOSIS — S43101A Unspecified dislocation of right acromioclavicular joint, initial encounter: Secondary | ICD-10-CM | POA: Insufficient documentation

## 2017-04-16 MED ORDER — BACITRACIN ZINC 500 UNIT/GM EX OINT
TOPICAL_OINTMENT | CUTANEOUS | Status: AC
  Administered 2017-04-16: 21:00:00
  Filled 2017-04-16: qty 0.9

## 2017-04-16 MED ORDER — TETANUS-DIPHTH-ACELL PERTUSSIS 5-2.5-18.5 LF-MCG/0.5 IM SUSP
0.5000 mL | Freq: Once | INTRAMUSCULAR | Status: AC
  Administered 2017-04-16: 0.5 mL via INTRAMUSCULAR
  Filled 2017-04-16: qty 0.5

## 2017-04-16 MED ORDER — HYDROMORPHONE HCL 2 MG/ML IJ SOLN
1.0000 mg | Freq: Once | INTRAMUSCULAR | Status: AC
  Administered 2017-04-16: 1 mg via INTRAVENOUS
  Filled 2017-04-16: qty 1

## 2017-04-16 MED ORDER — IBUPROFEN 600 MG PO TABS: 600.0000 mg | ORAL_TABLET | Freq: Four times a day (QID) | ORAL | 0 refills | Status: AC | PRN

## 2017-04-16 NOTE — ED Notes (Signed)
Patient transported to X-ray 

## 2017-04-16 NOTE — ED Provider Notes (Signed)
[facility]-EMERGENCY DEPT Provider Note   CSN: [id number] Arrival date & time: 04/16/17  1741     History   Chief Complaint Chief Complaint  Patient presents with  . Shoulder Injury    R shoulder dislocation    HPI Chad Hale is a 26 y.o. male.  Patient presents to the ED with a chief complaint of right shoulder pain.  He states that he was running down the road and tripped and fell landing on his right shoulder.  He states that it feels like something dislocated.  He has not taken anything for his pain.  The pain is worsened with movement and palpation.  He denies any numbness, weakness, or tingling.  He also complains of an abrasion to his right forearm.  He is uncertain of when he had his last tetanus shot.   The history is provided by the patient. No language interpreter was used.    History reviewed. No pertinent past medical history.  Patient Active Problem List   Diagnosis Date Noted  . Drug overdose   . MDD (major depressive disorder) 03/26/2017    History reviewed. No pertinent surgical history.     Home Medications    Prior to Admission medications   Medication Sig Start Date End Date Taking? Authorizing Provider  escitalopram (LEXAPRO) 5 MG tablet Take 1 tablet (5 mg total) by mouth daily. 04/01/17  Yes Adonis Brook, NP  hydrOXYzine (ATARAX/VISTARIL) 25 MG tablet Take 1 tablet (25 mg total) by mouth every 6 (six) hours as needed for anxiety. 03/31/17  Yes Adonis Brook, NP  traZODone (DESYREL) 100 MG tablet Take 1 tablet (100 mg total) by mouth at bedtime as needed for sleep. 03/31/17  Yes Adonis Brook, NP  ibuprofen (ADVIL,MOTRIN) 600 MG tablet Take 1 tablet (600 mg total) by mouth every 6 (six) hours as needed. 04/16/17   Roxy Horseman, PA-C    Family History No family history on file.  Social History Social History  Substance Use Topics  . Smoking status: Current Every Day Smoker    Packs/day: 1.00  . Smokeless tobacco: Never Used  . Alcohol use  No     Allergies   Shellfish allergy and Iodine   Review of Systems Review of Systems  All other systems reviewed and are negative.    Physical Exam Updated Vital Signs BP 125/82 (BP Location: Left Arm)   Pulse (!) 57   Resp 16   SpO2 100%   Physical Exam Nursing note and vitals reviewed.  Constitutional: Pt appears well-developed and well-nourished. No distress.  HENT:  Head: Normocephalic and atraumatic.  Eyes: Conjunctivae are normal.  Neck: Normal range of motion.  Cardiovascular: Normal rate, regular rhythm. Intact distal pulses.   Capillary refill < 3 sec.  Pulmonary/Chest: Effort normal and breath sounds normal.  Musculoskeletal:  RUE Pt exhibits TTP over right shoulder with mild swelling.   ROM: 4/5  Strength: 4/5  Neurological: Pt  is alert. Coordination normal.  Sensation: 5/5 Skin: Skin is warm and dry. Pt is not diaphoretic.  No evidence of open wound or skin tenting Psychiatric: Pt has a normal mood and affect.     ED Treatments / Results  Labs (all labs ordered are listed, but only abnormal results are displayed) Labs Reviewed - No data to display  EKG  EKG Interpretation None       Radiology Dg Shoulder Right  Result Date: 04/16/2017 CLINICAL DATA:  Right shoulder pain after injury. Fall landing on right upper extremity  without obvious deformity of right shoulder. EXAM: RIGHT SHOULDER - 2+ VIEW COMPARISON:  None. FINDINGS: No acute fracture. Glenohumeral alignment is maintained. The clavicle is slightly superiorly offset from the acromion at the acromioclavicular joint, suboptimally assessed due to positioning. There is no evidence of arthropathy. Soft tissues are unremarkable. IMPRESSION: Possible superior offset of the clavicle at the acromioclavicular joint, suboptimally assessed due to positioning. Dedicated acromioclavicular views could be obtained if there is concern for [facility] joint injury. No acute fracture.  Glenohumeral alignment is  maintained. Electronically Signed   By: Rubye Oaks M.D.   On: 04/16/2017 19:16    Procedures Procedures (including critical care time)  Medications Ordered in ED Medications  bacitracin 500 UNIT/GM ointment (not administered)  HYDROmorphone (DILAUDID) injection 1 mg (1 mg Intravenous Given 04/16/17 1853)  Tdap (BOOSTRIX) injection 0.5 mL (0.5 mLs Intramuscular Given 04/16/17 1950)     Initial Impression / Assessment and Plan / ED Course  I have reviewed the triage vital signs and the nursing notes.  Pertinent labs & imaging results that were available during my care of the patient were reviewed by me and considered in my medical decision making (see chart for details).     Patient X-Ray negative for obvious fracture, possible AC joint separation.  Pt advised to follow up with orthopedics. Patient given sling while in ED, conservative therapy recommended and discussed. Patient will be discharged home & is agreeable with above plan. Returns precautions discussed. Pt appears safe for discharge.   Final Clinical Impressions(s) / ED Diagnoses   Final diagnoses:  Separation of right acromioclavicular joint, initial encounter  Abrasion    New Prescriptions New Prescriptions   IBUPROFEN (ADVIL,MOTRIN) 600 MG TABLET    Take 1 tablet (600 mg total) by mouth every 6 (six) hours as needed.     Roxy Horseman, PA-C 04/16/17 2052    Mancel Bale, MD 04/17/17 [phone]

## 2017-04-16 NOTE — ED Notes (Signed)
Bed: [facility] Expected date:  Expected time:  Means of arrival:  Comments: EMS-fall-24 if needed

## 2017-04-16 NOTE — ED Notes (Signed)
Bed: [facility] Expected date:  Expected time:  Means of arrival:  Comments: [facility] 

## 2017-04-16 NOTE — ED Notes (Signed)
Pt smells of ETOH, fall today.  Pt is calm at this time.  Right shoulder is in a sling

## 2017-04-16 NOTE — ED Notes (Addendum)
Patients wound cleaned with saline, bacitracin applied, covered with Telfa and gauze, wrapped with koban.

## 2017-04-16 NOTE — ED Notes (Addendum)
Pt was undressed and helped into a hospital gown.  His belongings are at the bedside

## 2017-04-29 DIAGNOSIS — Y929 Unspecified place or not applicable: Secondary | ICD-10-CM | POA: Insufficient documentation

## 2017-04-29 DIAGNOSIS — S50311A Abrasion of right elbow, initial encounter: Secondary | ICD-10-CM | POA: Insufficient documentation

## 2017-04-29 DIAGNOSIS — F17200 Nicotine dependence, unspecified, uncomplicated: Secondary | ICD-10-CM | POA: Insufficient documentation

## 2017-04-29 DIAGNOSIS — Y999 Unspecified external cause status: Secondary | ICD-10-CM | POA: Insufficient documentation

## 2017-04-29 DIAGNOSIS — Y939 Activity, unspecified: Secondary | ICD-10-CM | POA: Insufficient documentation

## 2017-04-29 DIAGNOSIS — Z79899 Other long term (current) drug therapy: Secondary | ICD-10-CM | POA: Insufficient documentation

## 2017-04-29 DIAGNOSIS — W010XXA Fall on same level from slipping, tripping and stumbling without subsequent striking against object, initial encounter: Secondary | ICD-10-CM | POA: Insufficient documentation

## 2017-04-30 ENCOUNTER — Encounter (HOSPITAL_COMMUNITY): Payer: Self-pay | Admitting: Emergency Medicine

## 2017-04-30 ENCOUNTER — Emergency Department (HOSPITAL_COMMUNITY): Payer: Self-pay

## 2017-04-30 ENCOUNTER — Emergency Department (HOSPITAL_COMMUNITY)
Admission: EM | Admit: 2017-04-30 | Discharge: 2017-04-30 | Disposition: A | Discharge status: Home / Self Care | Payer: Self-pay | Attending: Emergency Medicine | Admitting: Emergency Medicine

## 2017-04-30 DIAGNOSIS — M25521 Pain in right elbow: Secondary | ICD-10-CM

## 2017-04-30 DIAGNOSIS — T148XXA Other injury of unspecified body region, initial encounter: Secondary | ICD-10-CM

## 2017-04-30 MED ORDER — NAPROXEN 500 MG PO TABS: 500.0000 mg | ORAL_TABLET | Freq: Two times a day (BID) | ORAL | 0 refills | Status: AC

## 2017-04-30 MED ORDER — NAPROXEN 500 MG PO TABS
500.0000 mg | ORAL_TABLET | Freq: Once | ORAL | Status: AC
  Administered 2017-04-30: 500 mg via ORAL
  Filled 2017-04-30: qty 1

## 2017-04-30 NOTE — ED Triage Notes (Signed)
Pt states he dislocated his right shoulder and had a ligament tear to his clavicle 10 days ago  Pt states that is ok right now but his right elbow is hurting  Pt states at rest his pain is like a 5 but if he rests his elbow on anything the pain goes up to a 10  Pt states they did not xray it when he was last here

## 2017-04-30 NOTE — ED Provider Notes (Signed)
[facility]-EMERGENCY DEPT Provider Note   CSN: [id number] Arrival date & time: 04/29/17  2354   By signing my name below, I, Teofilo Pod, attest that this documentation has been prepared under the direction and in the presence of [facility], PA-C. Electronically Signed: Teofilo Pod, ED Scribe. 04/30/2017. 1:55 AM.   History   Chief Complaint Chief Complaint  Patient presents with  . Arm Pain    The history is provided by the patient. No language interpreter was used.   HPI Comments:  Chad Hale is a 26 y.o. male who presents to the Emergency Department complaining of worsening left elbow pain x 10 days. Pt was running 10 days ago, tripped and fell, bracing his fall with his right arm. He was seen here after the fall and was diagnosed with a torn AC joint ligament, and was given a splint. He states that his shoulder pain has improved, but reports worsening elbow pain since the fall. 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 Tetanus UTD. No alleviating factors noted. Pt denies other associated symptoms. Denies numbness, tingling, weakness.  He is not using the sling given for his shoulder.  No treatments PTA.    History reviewed. No pertinent past medical history.  Patient Active Problem List   Diagnosis Date Noted  . Drug overdose   . MDD (major depressive disorder) 03/26/2017    History reviewed. No pertinent surgical history.     Home Medications    Prior to Admission medications   Medication Sig Start Date End Date Taking? Authorizing Provider  escitalopram (LEXAPRO) 5 MG tablet Take 1 tablet (5 mg total) by mouth daily. 04/01/17   Adonis Brook, NP  hydrOXYzine (ATARAX/VISTARIL) 25 MG tablet Take 1 tablet (25 mg total) by mouth every 6 (six) hours as needed for anxiety. 03/31/17   Adonis Brook, NP    ibuprofen (ADVIL,MOTRIN) 600 MG tablet Take 1 tablet (600 mg total) by mouth every 6 (six) hours as needed. 04/16/17   Roxy Horseman, PA-C  naproxen (NAPROSYN) 500 MG tablet Take 1 tablet (500 mg total) by mouth 2 (two) times daily with a meal. 04/30/17   Kaemon Barnett, PA-C  traZODone (DESYREL) 100 MG tablet Take 1 tablet (100 mg total) by mouth at bedtime as needed for sleep. 03/31/17   Adonis Brook, NP    Family History Family History  Problem Relation Age of Onset  . Hypertension Other   . Diabetes Other   . Stroke Other     Social History Social History  Substance Use Topics  . Smoking status: Current Every Day Smoker    Packs/day: 1.00  . Smokeless tobacco: Never Used  . Alcohol use Yes     Allergies   Shellfish allergy and Iodine   Review of Systems Review of Systems  Constitutional: Positive for fever. Negative for chills.  Musculoskeletal: Positive for arthralgias.  Skin: Positive for wound.  Neurological: Negative for numbness.  All other systems reviewed and are negative.    Physical Exam Updated Vital Signs BP 135/79 (BP Location: Left Arm)   Pulse 75   Temp 97.5 F (36.4 C) (Oral)   Resp 16   SpO2 99%   Physical Exam  Constitutional: He appears well-developed and well-nourished. No distress.  HENT:  Head: Normocephalic and atraumatic.  Eyes: Conjunctivae are normal.  Neck:  Normal range of motion.  Cardiovascular: Normal rate, regular rhythm and intact distal pulses.   Capillary refill < 3 sec  Pulmonary/Chest: Effort normal and breath sounds normal.  Musculoskeletal: He exhibits tenderness. He exhibits no edema.  Right elbow with almost full ROM with flexion and extension, pronation and supination or forearm. TTP along the proximal ulna without palpable deformity, no swelling. Abrasion is well healing without erythema induration or purulent drainage. Sensation intact to RUE. Strength 5/5 in RUE.   Neurological: He is alert. Coordination  normal.  Skin: Skin is warm and dry. He is not diaphoretic.  Psychiatric: He has a normal mood and affect.  Nursing note and vitals reviewed.    ED Treatments / Results  DIAGNOSTIC STUDIES:  Oxygen Saturation is 99% on RA, normal by my interpretation.    COORDINATION OF CARE:  1:52 AM Discussed treatment plan with pt at bedside and pt agreed to plan.   Radiology Dg Elbow Complete Right  Result Date: 04/30/2017 CLINICAL DATA:  Persistent pain after falling on 04/16/2017. EXAM: RIGHT ELBOW - COMPLETE 3+ VIEW COMPARISON:  None. FINDINGS: There is no evidence of fracture, dislocation, or joint effusion. There is no evidence of arthropathy or other focal bone abnormality. Soft tissues are unremarkable. IMPRESSION: Negative. Electronically Signed   By: Ellery Plunkaniel R Mitchell M.D.   On: 04/30/2017 01:37    Procedures Procedures (including critical care time)  Medications Ordered in ED Medications  naproxen (NAPROSYN) tablet 500 mg (not administered)     Initial Impression / Assessment and Plan / ED Course  I have reviewed the triage vital signs and the nursing notes.  Pertinent labs & imaging results that were available during my care of the patient were reviewed by me and considered in my medical decision making (see chart for details).     Patient X-Ray negative for obvious fracture or dislocation. Pain managed in ED. Pt advised to follow up with orthopedics if symptoms persist for possibility of missed fracture diagnosis. Patient given brace while in ED, conservative therapy recommended and discussed. Patient will be dc home & is agreeable with above plan.   Final Clinical Impressions(s) / ED Diagnoses   Final diagnoses:  Right elbow pain  Abrasion    New Prescriptions New Prescriptions   NAPROXEN (NAPROSYN) 500 MG TABLET    Take 1 tablet (500 mg total) by mouth 2 (two) times daily with a meal.    I personally performed the services described in this documentation, which  was scribed in my presence. The recorded information has been reviewed and is accurate.     Dierdre ForthHannah Santiel Topper, PA-C 04/30/17 0205    April Palumbo, MD 04/30/17 [phone]

## 2017-04-30 NOTE — Discharge Instructions (Signed)

## 2017-05-10 ENCOUNTER — Encounter (HOSPITAL_COMMUNITY): Payer: Self-pay | Admitting: Emergency Medicine

## 2017-05-10 ENCOUNTER — Emergency Department (HOSPITAL_COMMUNITY)
Admission: EM | Admit: 2017-05-10 | Discharge: 2017-05-10 | Disposition: A | Discharge status: Home / Self Care | Payer: Self-pay | Attending: Emergency Medicine | Admitting: Emergency Medicine

## 2017-05-10 ENCOUNTER — Emergency Department (HOSPITAL_COMMUNITY): Payer: Self-pay

## 2017-05-10 DIAGNOSIS — R0789 Other chest pain: Secondary | ICD-10-CM | POA: Insufficient documentation

## 2017-05-10 DIAGNOSIS — F17200 Nicotine dependence, unspecified, uncomplicated: Secondary | ICD-10-CM | POA: Insufficient documentation

## 2017-05-10 LAB — I-STAT TROPONIN, ED: TROPONIN I, POC: 0 ng/mL (ref 0.00–0.08)

## 2017-05-10 LAB — BASIC METABOLIC PANEL
ANION GAP: 10 (ref 5–15)
BUN: 9 mg/dL (ref 6–20)
CALCIUM: 9.2 mg/dL (ref 8.9–10.3)
CO2: 25 mmol/L (ref 22–32)
Chloride: 101 mmol/L (ref 101–111)
Creatinine, Ser: 1.07 mg/dL (ref 0.61–1.24)
GFR calc Af Amer: >60 mL/min (ref >60)
GFR calc non Af Amer: >60 mL/min (ref >60)
GLUCOSE: 94 mg/dL (ref 65–99)
Potassium: 3.8 mmol/L (ref 3.5–5.1)
Sodium: 136 mmol/L (ref 135–145)

## 2017-05-10 LAB — CBC
HCT: 44.5 % (ref 39.0–52.0)
HEMOGLOBIN: 15 g/dL (ref 13.0–17.0)
MCH: 28.4 pg (ref 26.0–34.0)
MCHC: 33.7 g/dL (ref 30.0–36.0)
MCV: 84.1 fL (ref 78.0–100.0)
Platelets: 265 10*3/uL (ref 150–400)
RBC: 5.29 MIL/uL (ref 4.22–5.81)
RDW: 13.4 % (ref 11.5–15.5)
WBC: 11.9 10*3/uL — ABNORMAL HIGH (ref 4.0–10.5)

## 2017-05-10 MED ORDER — KETOROLAC TROMETHAMINE 60 MG/2ML IM SOLN
60.0000 mg | Freq: Once | INTRAMUSCULAR | Status: AC
  Administered 2017-05-10: 60 mg via INTRAMUSCULAR
  Filled 2017-05-10: qty 2
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
## 2017-05-10 NOTE — Discharge Instructions (Signed)
Ibuprofen 600 mg every six hours for the next three days.   Return to the Emergency Department if your symptoms significantly worsen or change.

## 2017-05-10 NOTE — ED Triage Notes (Signed)
Patient with chest pain that he states that started after talking to [employer].  [facility]  Patient started having chest pain and called 911 due to the pain.

## 2017-05-10 NOTE — ED Notes (Signed)
Pt refused to dress into a gown.

## 2017-06-02 ENCOUNTER — Emergency Department (HOSPITAL_COMMUNITY)
Admission: EM | Admit: 2017-06-02 | Discharge: 2017-06-02 | Disposition: A | Discharge status: Home / Self Care | Payer: No Typology Code available for payment source | Attending: Emergency Medicine | Admitting: Emergency Medicine

## 2017-06-02 ENCOUNTER — Encounter (HOSPITAL_COMMUNITY): Payer: Self-pay | Admitting: Emergency Medicine

## 2017-06-02 DIAGNOSIS — S3992XA Unspecified injury of lower back, initial encounter: Secondary | ICD-10-CM | POA: Diagnosis not present

## 2017-06-02 DIAGNOSIS — S4991XA Unspecified injury of right shoulder and upper arm, initial encounter: Secondary | ICD-10-CM | POA: Diagnosis not present

## 2017-06-02 DIAGNOSIS — Y999 Unspecified external cause status: Secondary | ICD-10-CM | POA: Insufficient documentation

## 2017-06-02 DIAGNOSIS — Y9389 Activity, other specified: Secondary | ICD-10-CM | POA: Diagnosis not present

## 2017-06-02 DIAGNOSIS — S199XXA Unspecified injury of neck, initial encounter: Secondary | ICD-10-CM | POA: Insufficient documentation

## 2017-06-02 DIAGNOSIS — F17200 Nicotine dependence, unspecified, uncomplicated: Secondary | ICD-10-CM | POA: Diagnosis not present

## 2017-06-02 DIAGNOSIS — S4992XA Unspecified injury of left shoulder and upper arm, initial encounter: Secondary | ICD-10-CM | POA: Insufficient documentation

## 2017-06-02 DIAGNOSIS — Y92410 Unspecified street and highway as the place of occurrence of the external cause: Secondary | ICD-10-CM | POA: Insufficient documentation

## 2017-06-02 MED ORDER — MELOXICAM 7.5 MG PO TABS: 7.5000 mg | ORAL_TABLET | Freq: Every day | ORAL | 0 refills | Status: AC

## 2017-06-02 MED ORDER — CYCLOBENZAPRINE HCL 10 MG PO TABS
10.0000 mg | ORAL_TABLET | Freq: Two times a day (BID) | ORAL | 0 refills | Status: AC | PRN
Start: 2017-06-02 — End: ?
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
## 2017-06-02 NOTE — Discharge Instructions (Signed)
Please return to the emergency department if symptoms worsen or if you develop new symptoms. Please do not drive or go to work after taking Flexeril. Please do not take ibuprofen if you are taking the meloxicam. Muscle strains can take some time to heal. If symptoms persist for more than one week, you can call the number to get established with a primary care provider for follow-up. Here are some stretches that may help the muscles in your lower back.   Stretches for Low Back Pain      Back Flexion Stretch. Lying on the back, pull both knees to the chest while simultaneously flexing the head forward until a comfortable stretch is felt across the mid and low back.      Knee to Chest Stretch. Lie on the back with the knees bent and both heels on the floor, then place both hands behind one knee and pull it toward the chest, stretching the gluteus and piriformis muscles in the buttock.  Kneeling Lunge Stretch. Starting on both knees, move one leg forward so the foot is flat on the ground, keeping weight evenly distributed through both hips (rather than on one side or the other). Place both hands on the top of the thigh, and gently lean the body forward to feel a stretch in the front of the other leg. This stretch affects the hip flexor muscles, which attach to the pelvis and can impact posture if too tight.    Piriformis Muscle Stretch. Lie on the back with knees bent and both heels on the floor. Cross one leg over the other, resting the ankle on the bent knee, then gently pull the bottom knee toward the chest until a stretch is felt in the buttock. Or, lying on the floor, cross one leg over the other and pull it forward over the body at the knee, keeping the other leg flat.
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
## 2018-11-30 ENCOUNTER — Emergency Department (HOSPITAL_COMMUNITY)
Admission: EM | Admit: 2018-11-30 | Discharge: 2018-11-30 | Disposition: A | Discharge status: Home / Self Care | Payer: Self-pay | Attending: Emergency Medicine | Admitting: Emergency Medicine

## 2018-11-30 ENCOUNTER — Other Ambulatory Visit: Payer: Self-pay

## 2018-11-30 ENCOUNTER — Encounter (HOSPITAL_COMMUNITY): Payer: Self-pay | Admitting: *Deleted

## 2018-11-30 DIAGNOSIS — R51 Headache: Secondary | ICD-10-CM | POA: Insufficient documentation

## 2018-11-30 DIAGNOSIS — R519 Headache, unspecified: Secondary | ICD-10-CM

## 2018-11-30 DIAGNOSIS — Z79899 Other long term (current) drug therapy: Secondary | ICD-10-CM | POA: Insufficient documentation

## 2018-11-30 DIAGNOSIS — F17200 Nicotine dependence, unspecified, uncomplicated: Secondary | ICD-10-CM | POA: Insufficient documentation

## 2018-11-30 MED ORDER — IBUPROFEN 800 MG PO TABS: 800.0000 mg | ORAL_TABLET | Freq: Once | ORAL | Status: DC

## 2018-11-30 MED ORDER — FLUTICASONE PROPIONATE 50 MCG/ACT NA SUSP: 1.0000 | Freq: Every day | NASAL | 0 refills | Status: AC

## 2018-11-30 MED ORDER — KETOROLAC TROMETHAMINE 60 MG/2ML IM SOLN
60.0000 mg | Freq: Once | INTRAMUSCULAR | Status: AC
  Administered 2018-11-30: 60 mg via INTRAMUSCULAR
  Filled 2018-11-30: qty 2

## 2018-11-30 NOTE — ED Provider Notes (Signed)
[facility]-EMERGENCY DEPT Provider Note   CSN: [id number] Arrival date & time: 11/30/18  1442   History   Chief Complaint Chief Complaint  Patient presents with  . Headache    HPI Chad Hale is a 27 y.o. male who presents with a headache. No significant PMH. He states that yesterday he started to have gradual pressure over his nose and congestion. He tried Tylenol sinus medicine and 24 Claritin without relief. Today the pain got worse and when to his forehead. It is constant and non-radiating. Light makes it worse. Lying down makes it better. He's never had a headache like this before. No fever or nasal drainage. Denies LOC, trauma, neck stiffness, acute onset, worst headache of life.   HPI  History reviewed. No pertinent past medical history.  Patient Active Problem List   Diagnosis Date Noted  . Drug overdose   . MDD (major depressive disorder) 03/26/2017    History reviewed. No pertinent surgical history.      Home Medications    Prior to Admission medications   Medication Sig Start Date End Date Taking? Authorizing Provider  cyclobenzaprine (FLEXERIL) 10 MG tablet Take 1 tablet (10 mg total) by mouth 2 (two) times daily as needed for muscle spasms. 06/02/17   McDonald, Mia A, PA-C  escitalopram (LEXAPRO) 5 MG tablet Take 1 tablet (5 mg total) by mouth daily. 04/01/17   Adonis Brook, NP  fluticasone (FLONASE) 50 MCG/ACT nasal spray Place 1 spray into both nostrils daily. 11/30/18   Bethel Born, PA-C  hydrOXYzine (ATARAX/VISTARIL) 25 MG tablet Take 1 tablet (25 mg total) by mouth every 6 (six) hours as needed for anxiety. 03/31/17   Adonis Brook, NP  ibuprofen (ADVIL,MOTRIN) 600 MG tablet Take 1 tablet (600 mg total) by mouth every 6 (six) hours as needed. 04/16/17   Roxy Horseman, PA-C  meloxicam (MOBIC) 7.5 MG tablet Take 1 tablet (7.5 mg total) by mouth daily. 06/02/17   McDonald, Mia A, PA-C  naproxen (NAPROSYN) 500 MG tablet Take 1  tablet (500 mg total) by mouth 2 (two) times daily with a meal. 04/30/17   Muthersbaugh, Dahlia Client, PA-C  traZODone (DESYREL) 100 MG tablet Take 1 tablet (100 mg total) by mouth at bedtime as needed for sleep. 03/31/17   Adonis Brook, NP    Family History Family History  Problem Relation Age of Onset  . Hypertension Other   . Diabetes Other   . Stroke Other     Social History Social History   Tobacco Use  . Smoking status: Current Every Day Smoker    Packs/day: 1.00  . Smokeless tobacco: Never Used  Substance Use Topics  . Alcohol use: Yes  . Drug use: No     Allergies   Shellfish allergy and Iodine   Review of Systems Review of Systems  Constitutional: Negative for fever.  Eyes: Positive for photophobia.  Respiratory: Negative for shortness of breath.   Cardiovascular: Negative for chest pain.  Gastrointestinal: Negative for abdominal pain.  Neurological: Positive for headaches.  All other systems reviewed and are negative.    Physical Exam Updated Vital Signs BP 124/69 (BP Location: Left Arm)   Pulse 73   Temp 98.2 F (36.8 C) (Oral)   Resp 17   Ht 5\' 9"  (1.753 m)   Wt 74.8 kg   SpO2 97%   BMI 24.37 kg/m   Physical Exam  Constitutional: He is oriented to person, place, and time. He appears well-developed and well-nourished. No  distress.  Calm and cooperative. Photophobic  HENT:  Head: Normocephalic and atraumatic.  Right Ear: Hearing, tympanic membrane, external ear and ear canal normal.  Left Ear: Hearing, tympanic membrane, external ear and ear canal normal.  Nose: Mucosal edema present. Right sinus exhibits maxillary sinus tenderness and frontal sinus tenderness. Left sinus exhibits maxillary sinus tenderness and frontal sinus tenderness.  Mouth/Throat: Uvula is midline, oropharynx is clear and moist and mucous membranes are normal.  Eyes: Pupils are equal, round, and reactive to light. Conjunctivae are normal. Right eye exhibits no discharge. Left eye  exhibits no discharge. No scleral icterus.  Neck: Normal range of motion.  Cardiovascular: Normal rate and regular rhythm.  Pulmonary/Chest: Effort normal and breath sounds normal. No respiratory distress.  Abdominal: He exhibits no distension.  Neurological: He is alert and oriented to person, place, and time.  Lying on stretcher in NAD. GCS 15. Speaks in a clear voice. Cranial nerves II through XII grossly intact. 5/5 strength in all extremities. Sensation fully intact.  Bilateral finger-to-nose intact. Ambulatory   Skin: Skin is warm and dry.  Psychiatric: He has a normal mood and affect. His behavior is normal.  Nursing note and vitals reviewed.    ED Treatments / Results  Labs (all labs ordered are listed, but only abnormal results are displayed) Labs Reviewed - No data to display  EKG None  Radiology No results found.  Procedures Procedures (including critical care time)  Medications Ordered in ED Medications  ketorolac (TORADOL) injection 60 mg (60 mg Intramuscular Given 11/30/18 1900)     Initial Impression / Assessment and Plan / ED Course  I have reviewed the triage vital signs and the nursing notes.  Pertinent labs & imaging results that were available during my care of the patient were reviewed by me and considered in my medical decision making (see chart for details).  27 year old male presents with sinus headaches for 2 days. Vitals are normal. He is well appearing. Symptoms are mild. Neuro exam is normal. Will give IM Toradol and Flonase rx. Encouraged NSAIDs at home. Return precautions given.  Final Clinical Impressions(s) / ED Diagnoses   Final diagnoses:  Sinus headache    ED Discharge Orders         Ordered    fluticasone (FLONASE) 50 MCG/ACT nasal spray  Daily     11/30/18 1835           Bethel BornGekas, Gohan Collister Marie, PA-C 11/30/18 1916    Charlynne PanderYao, David Hsienta, MD 11/30/18 [phone]

## 2018-11-30 NOTE — Discharge Instructions (Signed)
Please take Ibuprofen 600mg  or Naproxen 500mg  for headache Use Flonase for nasal congestion Return if worsening

## 2018-11-30 NOTE — ED Triage Notes (Signed)
Pt states he has had a sinus throbbing headache for 2 days, worse today.

## 2019-01-22 IMAGING — DX DG CHEST 2V
2 series · 2 of 2 positions shown · non-contrast
Comparison: None.

CLINICAL DATA: 25-year-old male with chest pain.

EXAM:
CHEST  2 VIEW

[chest pa]
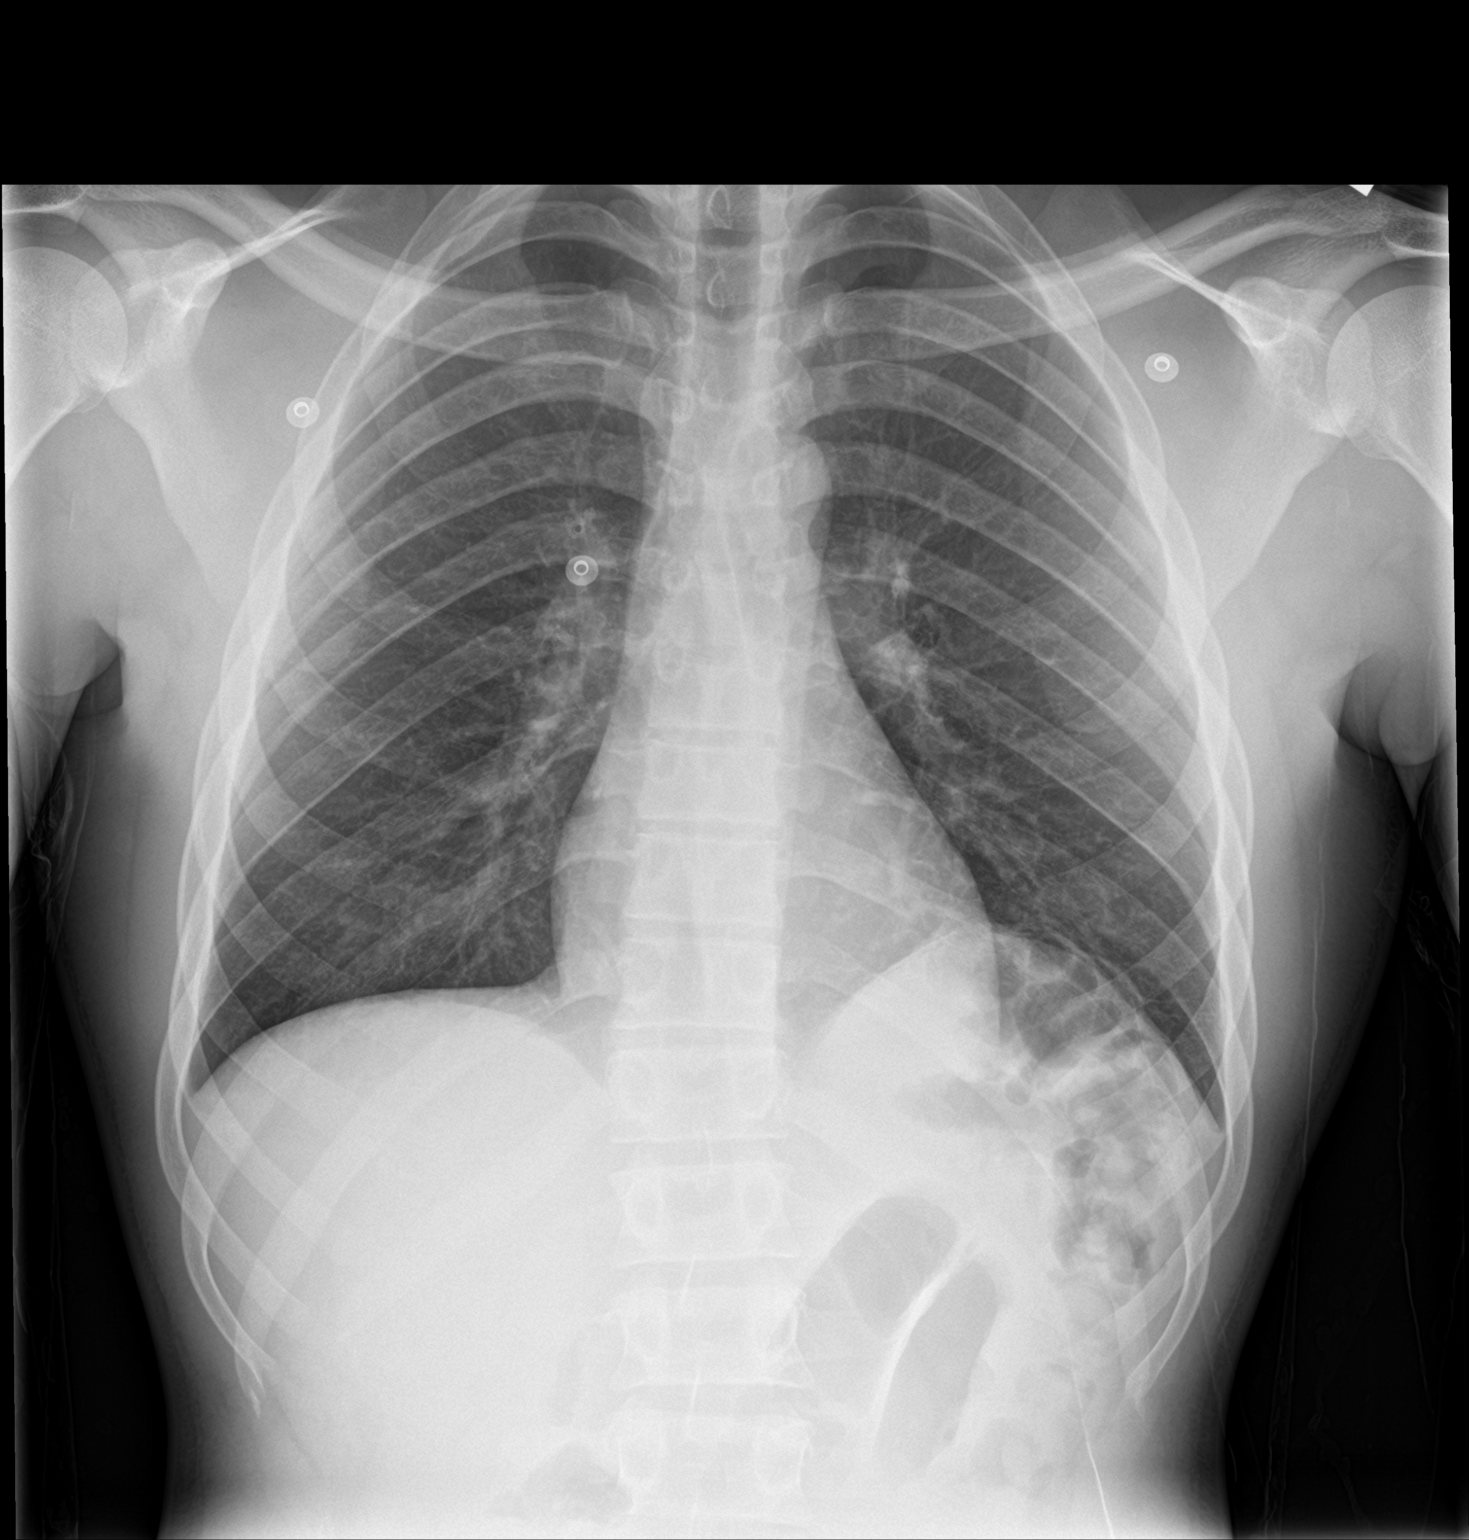

[chest lat]
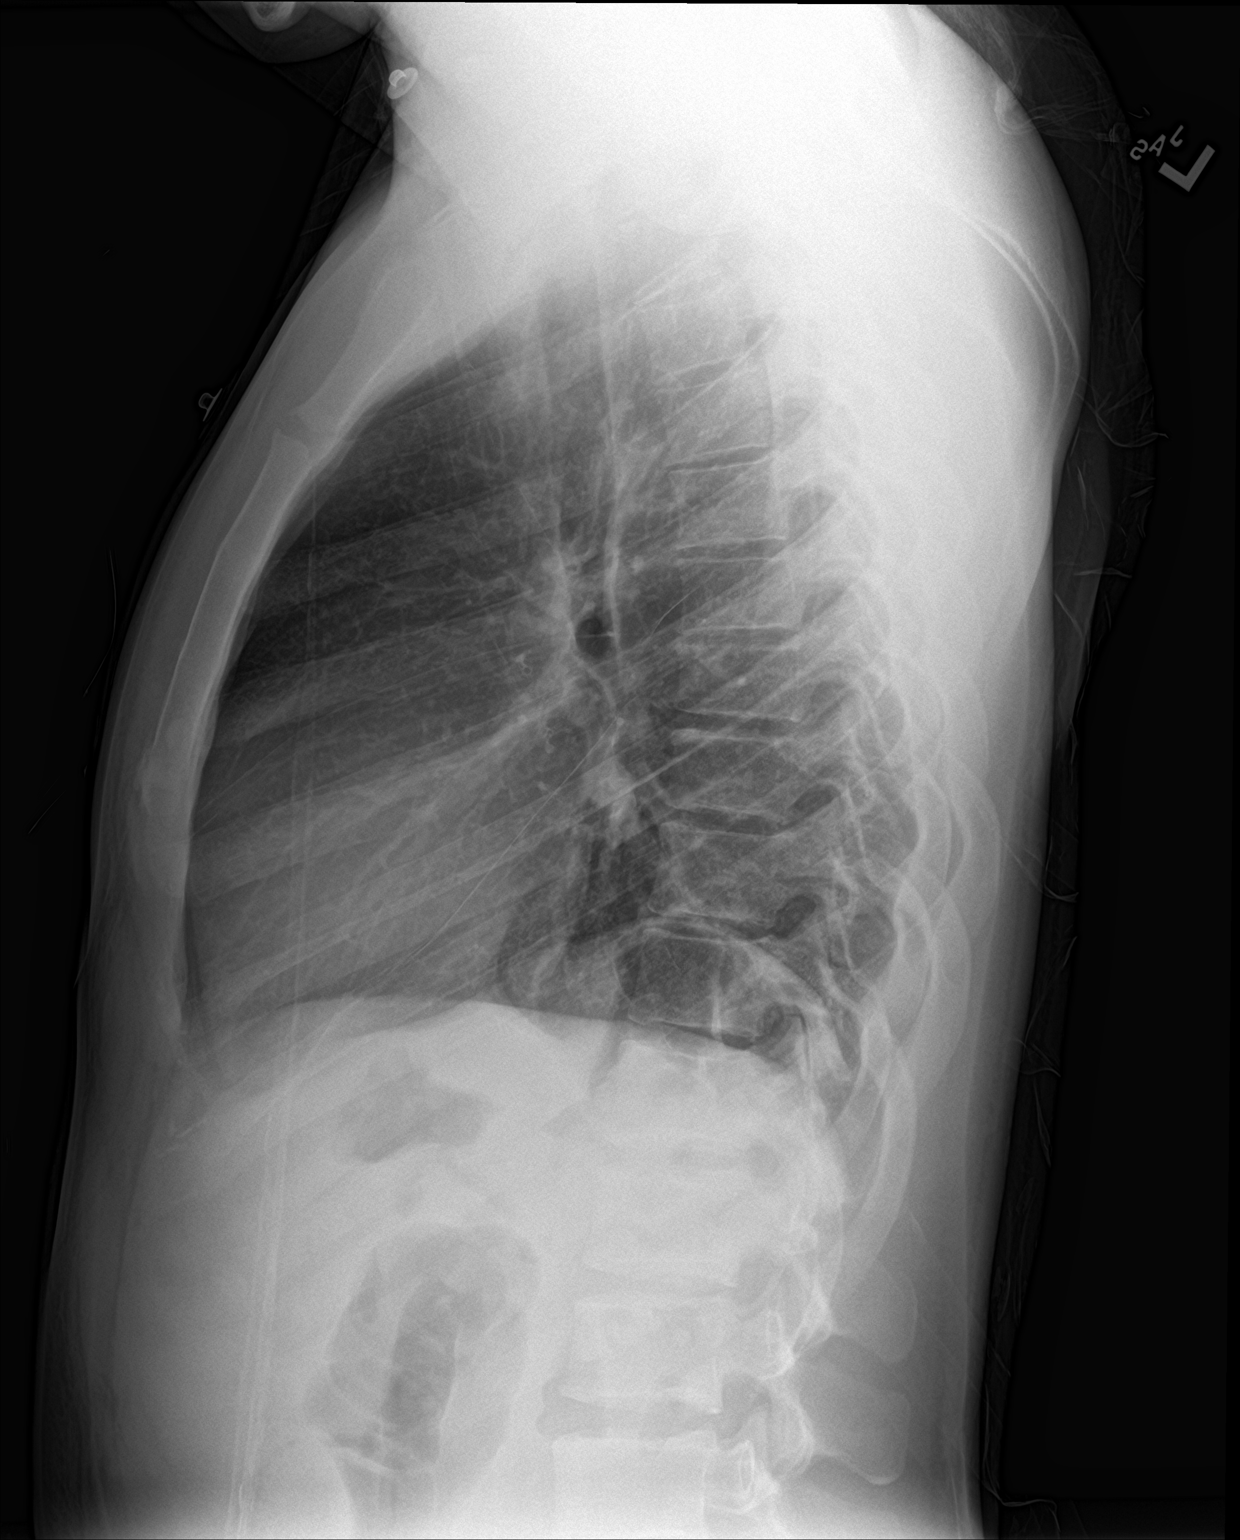

[2 of 2 positions shown; findings below may reference images not displayed]

FINDINGS: There is mild eventration of the left hemidiaphragm with minimal
left lung base atelectatic changes. No focal consolidation, pleural
effusion, or pneumothorax. The cardiac silhouette is within normal
limits. No acute osseous pathology.
IMPRESSION: No active cardiopulmonary disease.

## 2019-11-14 ENCOUNTER — Other Ambulatory Visit: Payer: Self-pay

## 2019-11-14 ENCOUNTER — Emergency Department (HOSPITAL_COMMUNITY)
Admission: EM | Admit: 2019-11-14 | Discharge: 2019-11-14 | Disposition: A | Discharge status: Home / Self Care | Payer: Medicaid Other | Attending: Emergency Medicine | Admitting: Emergency Medicine

## 2019-11-14 ENCOUNTER — Encounter (HOSPITAL_COMMUNITY): Payer: Self-pay | Admitting: Emergency Medicine

## 2019-11-14 DIAGNOSIS — Z5321 Procedure and treatment not carried out due to patient leaving prior to being seen by health care provider: Secondary | ICD-10-CM | POA: Diagnosis not present

## 2019-11-14 DIAGNOSIS — R110 Nausea: Secondary | ICD-10-CM | POA: Insufficient documentation

## 2019-11-14 NOTE — ED Triage Notes (Signed)
Pt states he started having sore throat, nausea, back yesterday. No known exposure to covid

## 2019-11-14 NOTE — ED Notes (Signed)
Called pt for vitals x3, no answer.
